# Patient Record
Sex: Male | Born: 1960 | Race: White | Hispanic: No | Marital: Married | Smoking: Current every day smoker
Health system: Southern US, Community
[De-identification: ages and names within clinical notes are randomized; demographics above are authoritative.]

## PROBLEM LIST (undated history)

## (undated) DIAGNOSIS — I359 Nonrheumatic aortic valve disorder, unspecified: Secondary | ICD-10-CM

## (undated) DIAGNOSIS — G8929 Other chronic pain: Secondary | ICD-10-CM

## (undated) DIAGNOSIS — R519 Headache, unspecified: Secondary | ICD-10-CM

## (undated) DIAGNOSIS — K029 Dental caries, unspecified: Secondary | ICD-10-CM

## (undated) DIAGNOSIS — G4700 Insomnia, unspecified: Secondary | ICD-10-CM

## (undated) DIAGNOSIS — D126 Benign neoplasm of colon, unspecified: Secondary | ICD-10-CM

## (undated) DIAGNOSIS — R51 Headache: Secondary | ICD-10-CM

## (undated) DIAGNOSIS — K561 Intussusception: Secondary | ICD-10-CM

## (undated) DIAGNOSIS — I10 Essential (primary) hypertension: Secondary | ICD-10-CM

## (undated) DIAGNOSIS — G2581 Restless legs syndrome: Secondary | ICD-10-CM

## (undated) DIAGNOSIS — F419 Anxiety disorder, unspecified: Secondary | ICD-10-CM

## (undated) DIAGNOSIS — E785 Hyperlipidemia, unspecified: Secondary | ICD-10-CM

## (undated) DIAGNOSIS — M549 Dorsalgia, unspecified: Secondary | ICD-10-CM

## (undated) DIAGNOSIS — J449 Chronic obstructive pulmonary disease, unspecified: Secondary | ICD-10-CM

## (undated) DIAGNOSIS — Z8673 Personal history of transient ischemic attack (TIA), and cerebral infarction without residual deficits: Secondary | ICD-10-CM

## (undated) DIAGNOSIS — M1990 Unspecified osteoarthritis, unspecified site: Secondary | ICD-10-CM

## (undated) DIAGNOSIS — R768 Other specified abnormal immunological findings in serum: Secondary | ICD-10-CM

## (undated) DIAGNOSIS — E119 Type 2 diabetes mellitus without complications: Secondary | ICD-10-CM

## (undated) HISTORY — PX: BACK SURGERY: SHX140

## (undated) HISTORY — DX: Intussusception: K56.1

## (undated) HISTORY — PX: BOWEL RESECTION: SHX1257

## (undated) HISTORY — PX: VASECTOMY: SHX75

## (undated) HISTORY — PX: CHOLECYSTECTOMY: SHX55

## (undated) HISTORY — DX: Other specified abnormal immunological findings in serum: R76.8

## (undated) HISTORY — DX: Type 2 diabetes mellitus without complications: E11.9

## (undated) HISTORY — PX: INTUSSUSCEPTION REPAIR: SHX1847

## (undated) HISTORY — DX: Benign neoplasm of colon, unspecified: D12.6

---

## 2000-04-27 ENCOUNTER — Encounter: Payer: Self-pay | Admitting: Neurosurgery

## 2000-04-27 ENCOUNTER — Ambulatory Visit (HOSPITAL_COMMUNITY): Admission: RE | Admit: 2000-04-27 | Discharge: 2000-04-27 | Payer: Self-pay | Admitting: Neurosurgery

## 2000-06-03 ENCOUNTER — Encounter: Payer: Self-pay | Admitting: Neurosurgery

## 2000-06-07 ENCOUNTER — Encounter: Payer: Self-pay | Admitting: Neurosurgery

## 2000-06-07 ENCOUNTER — Inpatient Hospital Stay (HOSPITAL_COMMUNITY): Admission: RE | Admit: 2000-06-07 | Discharge: 2000-06-10 | Payer: Self-pay | Admitting: Neurosurgery

## 2000-07-19 ENCOUNTER — Encounter: Payer: Self-pay | Admitting: Neurosurgery

## 2000-07-19 ENCOUNTER — Encounter: Admission: RE | Admit: 2000-07-19 | Discharge: 2000-07-19 | Payer: Self-pay | Admitting: Neurosurgery

## 2001-02-13 ENCOUNTER — Encounter: Payer: Self-pay | Admitting: Neurosurgery

## 2001-02-13 ENCOUNTER — Encounter: Admission: RE | Admit: 2001-02-13 | Discharge: 2001-02-13 | Payer: Self-pay | Admitting: Neurosurgery

## 2001-10-06 ENCOUNTER — Ambulatory Visit (HOSPITAL_COMMUNITY): Admission: RE | Admit: 2001-10-06 | Discharge: 2001-10-06 | Payer: Self-pay | Admitting: Neurosurgery

## 2001-10-06 ENCOUNTER — Encounter: Payer: Self-pay | Admitting: Neurosurgery

## 2003-12-23 ENCOUNTER — Emergency Department (HOSPITAL_COMMUNITY): Admission: EM | Admit: 2003-12-23 | Discharge: 2003-12-23 | Payer: Self-pay | Admitting: Emergency Medicine

## 2004-11-19 ENCOUNTER — Ambulatory Visit: Payer: Self-pay | Admitting: Cardiology

## 2006-03-04 ENCOUNTER — Encounter: Payer: Self-pay | Admitting: Emergency Medicine

## 2006-03-05 ENCOUNTER — Inpatient Hospital Stay (HOSPITAL_COMMUNITY): Admission: EM | Admit: 2006-03-05 | Discharge: 2006-03-07 | Payer: Self-pay | Admitting: Emergency Medicine

## 2006-03-05 ENCOUNTER — Ambulatory Visit: Payer: Self-pay | Admitting: Internal Medicine

## 2007-05-13 ENCOUNTER — Emergency Department (HOSPITAL_COMMUNITY): Admission: EM | Admit: 2007-05-13 | Discharge: 2007-05-13 | Payer: Self-pay | Admitting: Emergency Medicine

## 2008-12-22 ENCOUNTER — Emergency Department (HOSPITAL_COMMUNITY): Admission: EM | Admit: 2008-12-22 | Discharge: 2008-12-23 | Payer: Self-pay | Admitting: Emergency Medicine

## 2009-04-26 DIAGNOSIS — R768 Other specified abnormal immunological findings in serum: Secondary | ICD-10-CM

## 2009-04-26 HISTORY — DX: Other specified abnormal immunological findings in serum: R76.8

## 2010-01-29 ENCOUNTER — Ambulatory Visit: Payer: Self-pay | Admitting: Cardiology

## 2010-01-29 ENCOUNTER — Inpatient Hospital Stay (HOSPITAL_COMMUNITY): Admission: AD | Admit: 2010-01-29 | Discharge: 2010-01-31 | Payer: Self-pay

## 2010-01-30 ENCOUNTER — Encounter (INDEPENDENT_AMBULATORY_CARE_PROVIDER_SITE_OTHER): Payer: Self-pay | Admitting: Internal Medicine

## 2010-02-01 ENCOUNTER — Emergency Department (HOSPITAL_COMMUNITY): Admission: EM | Admit: 2010-02-01 | Discharge: 2010-02-02 | Payer: Self-pay | Admitting: Emergency Medicine

## 2010-03-11 ENCOUNTER — Emergency Department (HOSPITAL_COMMUNITY): Admission: EM | Admit: 2010-03-11 | Discharge: 2010-03-11 | Payer: Self-pay | Admitting: Emergency Medicine

## 2010-07-09 LAB — DIFFERENTIAL
Basophils Absolute: 0 10*3/uL (ref 0.0–0.1)
Basophils Relative: 1 % (ref 0–1)
Eosinophils Absolute: 0.3 10*3/uL (ref 0.0–0.7)
Eosinophils Relative: 1 % (ref 0–5)
Eosinophils Relative: 2 % (ref 0–5)
Eosinophils Relative: 3 % (ref 0–5)
Lymphocytes Relative: 29 % (ref 12–46)
Lymphs Abs: 2.7 10*3/uL (ref 0.7–4.0)
Monocytes Absolute: 0.9 10*3/uL (ref 0.1–1.0)
Monocytes Relative: 7 % (ref 3–12)
Monocytes Relative: 8 % (ref 3–12)
Neutro Abs: 7.9 10*3/uL — ABNORMAL HIGH (ref 1.7–7.7)
Neutrophils Relative %: 57 % (ref 43–77)
Neutrophils Relative %: 61 % (ref 43–77)

## 2010-07-09 LAB — GLUCOSE, CAPILLARY: Glucose-Capillary: 102 mg/dL — ABNORMAL HIGH (ref 70–99)

## 2010-07-09 LAB — CBC
HCT: 42 % (ref 39.0–52.0)
Hemoglobin: 13.8 g/dL (ref 13.0–17.0)
Hemoglobin: 14.3 g/dL (ref 13.0–17.0)
MCH: 33.7 pg (ref 26.0–34.0)
MCH: 34 pg (ref 26.0–34.0)
MCHC: 34.1 g/dL (ref 30.0–36.0)
MCV: 99.8 fL (ref 78.0–100.0)
MCV: 99.9 fL (ref 78.0–100.0)
Platelets: 241 10*3/uL (ref 150–400)
RBC: 4 MIL/uL — ABNORMAL LOW (ref 4.22–5.81)
RBC: 4.1 MIL/uL — ABNORMAL LOW (ref 4.22–5.81)
RBC: 4.21 MIL/uL — ABNORMAL LOW (ref 4.22–5.81)
RDW: 13.3 % (ref 11.5–15.5)
WBC: 12.3 10*3/uL — ABNORMAL HIGH (ref 4.0–10.5)

## 2010-07-09 LAB — HEPATIC FUNCTION PANEL
ALT: 104 U/L — ABNORMAL HIGH (ref 0–53)
AST: 51 U/L — ABNORMAL HIGH (ref 0–37)
Total Protein: 5.9 g/dL — ABNORMAL LOW (ref 6.0–8.3)

## 2010-07-09 LAB — LIPID PANEL
HDL: 37 mg/dL — ABNORMAL LOW (ref >39)
Triglycerides: 171 mg/dL — ABNORMAL HIGH (ref <150)
VLDL: 34 mg/dL (ref 0–40)

## 2010-07-09 LAB — URINALYSIS, ROUTINE W REFLEX MICROSCOPIC
Bilirubin Urine: NEGATIVE
Glucose, UA: NEGATIVE mg/dL
Hgb urine dipstick: NEGATIVE
Specific Gravity, Urine: 1.01 (ref 1.005–1.030)

## 2010-07-09 LAB — URINE CULTURE
Colony Count: 5000
Culture  Setup Time: 201110072117
Special Requests: NEGATIVE

## 2010-07-09 LAB — APTT: aPTT: 29 seconds (ref 24–37)

## 2010-07-09 LAB — COMPREHENSIVE METABOLIC PANEL
AST: 156 U/L — ABNORMAL HIGH (ref 0–37)
AST: 37 U/L (ref 0–37)
Albumin: 4.2 g/dL (ref 3.5–5.2)
Alkaline Phosphatase: 56 U/L (ref 39–117)
BUN: 10 mg/dL (ref 6–23)
BUN: 15 mg/dL (ref 6–23)
CO2: 28 mEq/L (ref 19–32)
Chloride: 103 mEq/L (ref 96–112)
Chloride: 105 mEq/L (ref 96–112)
Creatinine, Ser: 0.95 mg/dL (ref 0.4–1.5)
GFR calc Af Amer: >60 mL/min (ref >60)
GFR calc Af Amer: >60 mL/min (ref >60)
GFR calc non Af Amer: >60 mL/min (ref >60)
Glucose, Bld: 85 mg/dL (ref 70–99)
Potassium: 3 mEq/L — ABNORMAL LOW (ref 3.5–5.1)
Sodium: 140 mEq/L (ref 135–145)
Total Bilirubin: 0.8 mg/dL (ref 0.3–1.2)
Total Protein: 6.8 g/dL (ref 6.0–8.3)

## 2010-07-09 LAB — BASIC METABOLIC PANEL
CO2: 28 mEq/L (ref 19–32)
Chloride: 101 mEq/L (ref 96–112)
GFR calc Af Amer: >60 mL/min (ref >60)
GFR calc non Af Amer: >60 mL/min (ref >60)
Glucose, Bld: 98 mg/dL (ref 70–99)
Potassium: 3.3 mEq/L — ABNORMAL LOW (ref 3.5–5.1)
Potassium: 4 mEq/L (ref 3.5–5.1)
Sodium: 135 mEq/L (ref 135–145)
Sodium: 138 mEq/L (ref 135–145)

## 2010-07-09 LAB — HEMOGLOBIN A1C: Mean Plasma Glucose: 123 mg/dL — ABNORMAL HIGH (ref <117)

## 2010-07-09 LAB — CARDIAC PANEL(CRET KIN+CKTOT+MB+TROPI)
CK, MB: 2.3 ng/mL (ref 0.3–4.0)
Relative Index: INVALID (ref 0.0–2.5)
Total CK: 76 U/L (ref 7–232)
Troponin I: 0.02 ng/mL (ref 0.00–0.06)

## 2010-07-09 LAB — HEPATITIS PANEL, ACUTE
HCV Ab: REACTIVE — AB
Hep A IgM: NEGATIVE
Hep B C IgM: NEGATIVE

## 2010-07-09 LAB — RAPID URINE DRUG SCREEN, HOSP PERFORMED
Barbiturates: NOT DETECTED
Opiates: POSITIVE — AB

## 2010-07-09 LAB — PROTIME-INR
INR: 0.89 (ref 0.00–1.49)
Prothrombin Time: 12.2 seconds (ref 11.6–15.2)

## 2010-07-09 LAB — BRAIN NATRIURETIC PEPTIDE: Pro B Natriuretic peptide (BNP): 103 pg/mL — ABNORMAL HIGH (ref 0.0–100.0)

## 2010-07-09 LAB — MAGNESIUM: Magnesium: 1.9 mg/dL (ref 1.5–2.5)

## 2010-08-01 LAB — COMPREHENSIVE METABOLIC PANEL
AST: 63 U/L — ABNORMAL HIGH (ref 0–37)
BUN: 9 mg/dL (ref 6–23)
CO2: 27 mEq/L (ref 19–32)
Calcium: 10.6 mg/dL — ABNORMAL HIGH (ref 8.4–10.5)
Creatinine, Ser: 1.07 mg/dL (ref 0.4–1.5)
GFR calc Af Amer: >60 mL/min (ref >60)
GFR calc non Af Amer: >60 mL/min (ref >60)

## 2010-08-01 LAB — DIFFERENTIAL
Eosinophils Relative: 0 % (ref 0–5)
Lymphocytes Relative: 12 % (ref 12–46)
Lymphs Abs: 1.9 10*3/uL (ref 0.7–4.0)
Neutro Abs: 13.2 10*3/uL — ABNORMAL HIGH (ref 1.7–7.7)
Neutrophils Relative %: 85 % — ABNORMAL HIGH (ref 43–77)

## 2010-08-01 LAB — LIPASE, BLOOD: Lipase: 13 U/L (ref 11–59)

## 2010-08-01 LAB — CBC
MCHC: 34.4 g/dL (ref 30.0–36.0)
MCV: 93.7 fL (ref 78.0–100.0)
RBC: 5.65 MIL/uL (ref 4.22–5.81)

## 2010-08-01 LAB — POCT CARDIAC MARKERS

## 2010-09-11 NOTE — Discharge Summary (Signed)
Garrett Mitchell, Garrett Mitchell                ACCOUNT NO.:  [id number]   MEDICAL RECORD NO.:  [id number]          PATIENT TYPE:  INP   LOCATION:  3705                         FACILITY:  [facility]   PHYSICIAN:  Noralyn Pick. Eden Emms, MD, FACCDATE OF BIRTH:  10-29-1960   DATE OF ADMISSION:  03/05/2006  DATE OF DISCHARGE:  03/07/2006                                 DISCHARGE SUMMARY   PRIMARY CARDIOLOGIST:  Dr. Myrtis Ser.   PRIMARY CARE PHYSICIAN:  Dr. Clelia Croft in [place].   PRIMARY DIAGNOSIS:  Atypical chest pain.   SECONDARY DIAGNOSES:  1. Chronic low back pain, status post lumbar operation.  2. Bicuspid aortic valve, diagnosed in childhood.  3. Type 2 diabetes.  4. Hyperlipidemia.   PROCEDURES PERFORMED DURING THIS HOSPITALIZATION:  Stress Myoview study on  March 07, 2006.   ALLERGIES:  ULTRAM.   HISTORY OF PRESENT ILLNESS:  This is a 50 year old Caucasian male with a  past medical history significant for diabetes, tobacco abuse, and a family  history of premature coronary artery disease who presented to the emergency  room with a six-hour history of substernal chest pain, which he describes on  the left side of his chest, sharp, radiating around to his back.  He also  describes it as burning, worsening by inspiration.  He was seen at [facility] where his troponins were negative, but EKG 1- to 2-mm ST-  segment depression in leads V4 through V6.  The patient also has a history  of hypertension, with a blood pressure of 180/110 at [facility].  The  patient was treated in the emergency room with aspirin, heparin, Lopressor,  nitroglycerin, and morphine and transferred to [facility] Emergency Room for  further evaluation.   HOSPITAL COURSE:  On arrival to the emergency room, the patient's blood  pressure was 176/106, pulse of 60, and complaining of chest pain at 7/10.  The patient apparently had had a prescription for clonidine 0.2 mg four  times a day but stopped it two days ago because he ran  out of his  medication.  He usually wears a fentanyl patch for chronic back pain, which  he has not been using for the past several days also because he ran out of  his medication.  The patient was admitted for further evaluation and  treatment and was seen by Dr. Willa Rough over the weekend.  The patient's  EKG showed sinus rhythm with LVH noted with T-wave inversion in V3 and V4  with normalization V5 and V6.  Subsequent EKGs on day of discharge revealed  continued LVH.  The patient was treated with morphine q.4 h., which he  requested every 4 hours for continued low back pain.  He continued to have  some mild left-sided chest discomfort throughout hospitalization.  The  patient was started on heparin, and cardiac biomarkers were evaluated q.8 h.  Cardiac biomarkers were negative x3, with troponin at 0.01, CK 60, CK-MB  2.9.  The patient's hemoglobin Alc was found to be mildly elevated at 6.2.   Secondary to continued hypertension, the patient was started on  Lopressor 50  mg p.o. b.i.d.  His clonidine was not restarted during hospitalization.  Lipitor was also started during hospitalization secondary to elevated  cholesterol and LDL with a total cholesterol of 203, LDL of 110, HDL 82,  with triglycerides of 56.   The patient was seen by Dr. Willa Rough on March 05, 2006.  The patient  had negative cardiac enzymes but continued to have much back pain.  The  patient was also evaluated for pulmonary embolus, which was negative.  He  also had a CT scan to evaluate for aortic dissection, which was also  negative.  The patient continued stable, and a Myoview stress test was  ordered for March 06, 2006.  Myoview stress test was completed on  March 07, 2006, normal as read per Dr. Dietrich Pates.  This was an adenosine  stress test.  The patient did experience chronic low back pain throughout  the test but denied any chest discomfort throughout the test.  Followup  nuclear scintigraphy,  per Dr. Dietrich Pates, revealed negative for evidence of  ischemia.   LABS ON DISCHARGE:  Hemoglobin 12.9, hematocrit 37.7, white blood cells  11.2, platelets 259.  Sodium 136, potassium 3.2, chloride 100, CO2 of 26,  BUN 4, creatinine 0.09, glucose 126.  PT 12.2, INR 0.9, PTT 27.  Cardiac  enzymes were negative throughout hospitalization:  0.01, 0.02, and 0.01  respectively.  Cholesterol 203, LDL 110, HDL 82, triglycerides 56.  Calcium  was 10.  Hemoglobin Alc 6.2.   DISCHARGE MEDICATIONS:  1. Clonidine 0.2 mg four times a day (prescription given).  2. Fentanyl patches as directed by primary care physician.  3. Xanax 1 mg twice a day as directed by primary care physician.  4. Valium 10 mg at the time as directed by primary care physician.  5. Lipitor 40 mg at bedtime (new prescription).  6. Atenolol 100 mg at bedtime.   FOLLOWUP/APPOINTMENTS:  Patient should be scheduled for a follow-up  appointment with his primary care physician, Dr. Clelia Croft.  Echocardiogram  should be completed through [facility] in [place], as echocardiogram  was not completed during hospitalization.  The patient was given  instructions concerning his medications along with a new prescription for  Lipitor secondary to his elevated cholesterol.  The patient is to continue  with the clonidine and atenolol for blood pressure control.  He has also  been advised to be on a low-cholesterol diet.   Duration of discharge and encounter was 30 minutes.      [name], NP      Noralyn Pick. Eden Emms, MD, [facility]  Electronically Signed    [name]  D:  03/07/2006  T:  03/08/2006  Job:  [id number]   cc:   Dr. Clelia Croft in [place]

## 2010-09-11 NOTE — Discharge Summary (Signed)
[place]  Patient:    Garrett Mitchell, Garrett Mitchell                       MRN: [id number] Adm. Date:  [id number] Disc. Date: [id number] Attending:  Emeterio Reeve                           Discharge Summary  ADMITTING DIAGNOSIS:  L4/L5 and L5/S1 spondylosis.  DISCHARGE DIAGNOSIS:  L4/L5 and L5/S1 spondylosis.  PROCEDURE:  L4/L5 and L5/S1 laminectomy, diskectomy, posterior interbody fusion with Ray threaded fusion cage.  SURGEON:  Payton Doughty, M.D.  COMPLICATIONS:  None.  DISCHARGE:  Feeling well.  HISTORY OF PRESENT ILLNESS:  A 50 year old, right-handed, white gentleman whose history and physical is recounted in the chart.  January 2000 he fell 10 feet landing on his back, and he has had relentless increase in his back pain. MRI and plain films showed a chip fracture at the anterior L5 vertebral body and degenerative change at L5/S1.  Discography was positive at L4/L5 and him having failed conservative management, he is therefore admitted for fusion.  PAST MEDICAL HISTORY:  Anxiety.  MEDICATIONS:  He is on Xanax, trazodone, Percocet, and Vioxx.  ALLERGIES:  ULTRAM.  PAST SURGICAL HISTORY:  Bowel obstruction and cholecystectomy.  He has bicuspid aortic valve for which he receives antibiotics.  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  FAMILY HISTORY:  Mom is 51 and in good health, dad is deceased; he had hypertension and Cushings disease.  REVIEW OF SYSTEMS:  Remarkable for back pain.  PHYSICAL EXAMINATION:  GENERAL:  Unremarkable.  EXTREMITIES:  Somewhat limited range of motion in his back.  NEUROLOGIC:  He is awake, alert, and oriented.  Cranial nerves are intact. Motor exam shows intact strength.  Straight leg raise is negative.  Reflexes are 1 in the lower extremities, knees, and ankles.  He cannot range much more than about 15 degrees.   He cannot twist at all.  HOSPITAL COURSE:  He was admitted after ascertaining normal laboratory values. He underwent a L4/L5 and L5/S1 posterior lumbar interbody fusion. Postoperatively he did well.  Foley was removed the first day and physical therapy was started.  PCA was stopped the second day and by the third day he was awake, up and about, eating and voiding normally, and mobile and competent with his ADLs.  He was discharged on June 10, 2000, on OxyContin.  His followup will be in the [facility] office in a week for suture removal. DD:  08/24/00 TD:  08/25/00 Job: [id number]

## 2010-09-11 NOTE — Op Note (Signed)
[place]  Patient:    JOACHIM, CARTON                       MRN: [id number] Proc. Date: 06/07/00 Adm. Date:  [id number] Attending:  Emeterio Reeve                           Operative Report  PREOPERATIVE DIAGNOSIS:  Spondylosis L4-5, L5-S1.  POSTOPERATIVE DIAGNOSIS:  Spondylosis L4-5, L5-S1, with annular disruption at L4-5.  PROCEDURE:  L4-5, L5-S1 laminectomy and diskectomy, posterior lumbar interbody fusion with Ray Threaded Fusion Cage.  ANESTHESIA:  General endotracheal.  PREPARATION:  Sterile Betadine prep and scrub with alcohol wipe.  CLINICAL NOTE:  This is a 50 year old right-handed white gentleman who after a fall has had increasing back pain.  DESCRIPTION OF PROCEDURE:  He was taken to the operating room and and smoothly incised and intubated, placed prone on the operating table.  Following shave, prep, and drape in the usual sterile fashion, skin was infiltrated with 1% lidocaine and 1:400,000 epinephrine.  The skin was incised from the top of S1 to the bottom of L3, and the laminae of L4, L5, and S1 were exposed bilaterally in subperiosteal plane.  Intraoperative x-ray confirmed correctness of level.  The pars interarticularis, lamina, and inferior facet of L4 and L5 and the superior facet of L5 and S1 were removed bilaterally and the bone set aside for grafting.  Ligamentum flavum was removed, and the L4, L5, and S1 nerve roots were dissected free as they rounded their respective pedicles.  At L4-5, there was an annular disruption on the left side.  There was no disk material protruding through it.  At L5-S1, there was significant degenerative disease with facet arthropathy and synovium protruding from the facet joints.  Removal of the disk facilitated placement of Ray Threaded Fusion Cages, 16 x 21 mm.  Intraoperative x-ray was taken and confirmed good placement of the cages.  The nerve roots were carefully inspected and  found to be free.  The wound was irrigated and hemostasis assured.  The cages were packed with bone graft harvested from the facet joints and capped.  The fascia was reapproximated with 0 Vicryl in interrupted fashion.  Subcutaneous tissue was reapproximated with 0 Vicryl in interrupted fashion, subcuticular tissue was reapproximated with 3-0 Vicryl in interrupted fashion.  The skin was closed with 3-0 nylon in a running locked fashion.  Betadine and Telfa dressing was applied and made occlusive with OpSite.  The patient returned to the recovery room in good condition. DD:  06/07/00 TD:  06/08/00 Job: [id number]

## 2010-09-11 NOTE — H&P (Signed)
NAMECALIPH, BOROWIAK                ACCOUNT NO.:  [id number]   MEDICAL RECORD NO.:  [id number]          PATIENT TYPE:  INP   LOCATION:  3705                         FACILITY:  [facility]   PHYSICIAN:  Brayton El, MD    DATE OF BIRTH:  01-29-1961   DATE OF ADMISSION:  03/05/2006  DATE OF DISCHARGE:                                HISTORY & PHYSICAL   CHIEF COMPLAINT:  Chest pain radiating to the back.   HISTORY OF PRESENT ILLNESS:  The patient is a 50 year old white male, past  medical history significant for diabetes, tobacco use, and family history of  premature coronary artery disease presenting with a six-hour history of  substernal chest pain.  The patient states that around 6 p.m. this evening  he became very nauseous and vomited.  He shortly developed a burning  substernal chest pain that radiated directly through to his back that was  worsened by deep inspiration.  He reported to [facility] where  troponin was negative, but EKG showed 1 to 2 mm ST segment depression in  leads V4 through V6.  He was also very hypertensive at times, blood pressure  in excess of 180/110.  The patient was given aspirin, heparin, Lopressor,  nitroglycerin, and morphine, and transferred here to [facility] Emergency  Room.  Upon arrival here, the patient with a blood pressure of 176/106,  pulse of 60 complaining of 7/10 substernal chest pressure.  The patient  states that it continues to be associated with some nausea, but he denies  any shortness of breath or diaphoresis.  He also denies any previous episode  of such pain.  Of note, the patient states that he usually takes clonidine  0.2 mg p.o. q.i.d. but stopped  this two days ago because he ran out of the  medication.  The patient also usually takes a Fentanyl patch which he has  not been using for the past several days because he also ran out of this  medication.   ALLERGIES:  ULTRAM.   MEDICATIONS:  1. Clonidine 0.2 mg p.o. q.i.d.  2. Fentanyl patch.  3. Xanax 1 mg p.o. q.i.d.  4. Valium 10 mg p.o. q.h.s.   PAST MEDICAL HISTORY:  1. Type 2 diabetes.  2. Chronic low back pain status post some sort of lumbar operation.  3. Bicuspid aortic valve diagnosed in childhood.  4. Blocked bowel as a child.   SOCIAL HISTORY:  Positive for tobacco use.  The patient continues to smoke  one pack per day.  Negative for alcohol use.   FAMILY HISTORY:  The patient states his father died in his 58's from a  myocardial infarction.   REVIEW OF SYSTEMS:  Positive for some blurry vision times the past 48 hours  and some dizziness.  Chest pain as in HPI.  Also positive for a cough that  is mildly productive.  Positive for nausea, vomiting.  The patient denies  any hematochezia, melena, or hematemesis.  MUSCULOSKELETAL:  The patient  does complain of chronic lower back pain which is different than the  substernal chest  pain that is radiating through is upper back.  All other  systems are reviewed and are negative.   PHYSICAL EXAMINATION:  Temperature 98, pulse 69, blood pressure 181/101,  satting 99% on 2 liters.  GENERAL:  He is in moderate discomfort.  HEENT:  Normocephalic, atraumatic.  Extraocular movements are intact.  NECK:  Supple.  There are no JVD or bruits.  HEART:  Regular rate and rhythm with a 2/6 systolic murmur heard best at the  left lower sternal border.  LUNGS:  Clear to auscultation bilaterally.  ABDOMEN:  Soft, nontender, positive bowel sounds.  EXTREMITIES:  No cyanosis, clubbing, or edema.  SKIN:  No rashes or lesions.  NEUROLOGIC:  No focal deficits.  PSYCHIATRIC:  The patient is appropriate.   Chest x-ray was within normal limits.   EKG at the outside hospital showed normal sinus rhythm with T wave  inversions in V2 through V6 and 1 to 2 mm ST segment depression in leads V4  through V6 and biphasic T waves in leads 2 and aVF.  EKG upon arrival to  [facility] Emergency Room shows 1 to 2 mm ST segment  depression in leads V3,  V4, and biphasic T waves 2, 3, and aVF, normal sinus rhythm.   LABORATORY DATA:  Sodium 136, potassium 3.2, chloride 100, CO2 26, BUN 4,  creatinine 0.9, glucose 126, white count 14,000, hemoglobin 15.3, hematocrit  45.5, platelets 387.  CK-MB is 5.7.  Troponin is less than 0.05, myoglobin  is 58.6, INR is 0.9, BNP is 50.3, calcium is 10, magnesium is 1.8.   ASSESSMENT:  1. Patient with atypical chest pain for acute coronary syndrome but has      multiple risk factors and EKG changes consistent with anterior lateral      ischemia.  2. Hypertensive urgency.  3. Diabetes.  4. Tobacco use.   PLAN:  1. Will continue the patient on aspirin, heparin protocol, and      nitroglycerin.  2. Will add Lopressor 50 mg p.o. b.i.d. and Lipitor 40 mg p.o. q.h.s.  3. Will aggressively control blood pressure for this hypertensive urgency      which is most likely a result of rebound from stopping clonidine two      days ago.  Will titrate nitroglycerin up to desired effect and use      Lopressor IV p.r.n. to maintain a systolic blood pressure less than      150.  4. If cardiac enzymes were to increase, we will add a 2B3 inhibitor to his      medical regimen.  5. Because of the patient's very high blood pressures and sharp chest pain      radiating directly to his back, we will check a CT scan to the chest to      rule out a dissection as well as a pulmonary embolism.  6. Will check a fasting lipid panel in the morning.  7. Will replace electrolytes.  8. Would favor left heart catheterization in the a.m. considering risk      factors and ischemic EKG changes assuming that the patient rules out      for aortic dissection and pulmonary embolism.      Brayton El, MD  Electronically Signed     SGA/MEDQ  D:  03/05/2006  T:  03/05/2006  Job:  [id number]

## 2010-09-11 NOTE — H&P (Signed)
[place] [place] [facility]  Patient:    Garrett Mitchell, Garrett Mitchell                         MRN: [id number] Adm. Date:  06/07/00 Attending:  Payton Doughty, M.D.                         History and Physical  ADMISSION DIAGNOSIS:  L4-5 spondylosis, L5 anterior fracture, and L5-S1 spondylosis.  HISTORY OF PRESENT ILLNESS:  This is a 50 year old right-handed white gentleman in January of 2000 took a fall of 9 to 10 feet landing on his back on concrete.  He got up slowly and over the next few days had slow relentless increase in low back pain and episodic numbness in the anterior legs. Numbness in his legs has resolved with intermittent difficulty, but he has persistent low back pain with any activity.  If he lies down and puts a pillow behind his legs he can get somewhat comfortable.  Bladder has not been effected.  MRI and plane films demonstrated chip fracture of the anterior L5 vertebral body and degenerative change at L5-S1.  Diskography was positive at 4-5 and 5-1.  He is therefore admitted for a fusion at these levels having failed conservative management.  PAST MEDICAL HISTORY:  Remarkable for anxiety.  He is on Xanax 1 mg four times a day, Trasodone 150 mg once a day, Percocet on a p.r.n. basis, and Vioxx 25 mg a day.  ALLERGIES:  ULTRAM.  PAST SURGICAL HISTORY:  He had a bowel obstruction repaired at 52-1/50 years of age and a cholecystectomy done in 1998 at [place].  He has a bicuspid aortic valve for which he receives antibiotics for dental work.  SOCIAL HISTORY:  He smokes one pack of cigarettes a day.  He does not drink alcohol.  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  FAMILY HISTORY:  His mother is 44 and in good health.  Father is deceased. He had hypertension and Cushings disease.  REVIEW OF SYSTEMS:  Remarkable for back pain and depression.  PHYSICAL EXAMINATION:  HEENT:  Within normal limits.  He has reasonable range of motion in his  neck.  CHEST:  Clear.  HEART:  Regular rate and rhythm with a 2/6 holosystolic murmur.  ABDOMEN:  Nontender with no hepatosplenomegaly.  EXTREMITIES:  Without clubbing or cyanosis.  GENITOURINARY:  Deferred.  Peripheral pulses are good.  NEUROLOGICAL:  He is awake, alert, and oriented.  Cranial nerves II-XII intact. Motor examination shows 5/5 strength throughout the upper and lower extremities.  Straight leg raising is negative.  Reflexes are 1 in the lower extremities at the knees and ankles without sensory deficit.  Back has stiffness.  He cannot range much more than about 15 degrees without stopping. He cannot twist at all.  There is tenderness to palpation at the level of the posterior superior iliac spine in the midline.  Radiographic results have been reviewed above.  IMPRESSION: Lumbar spondylosis and lumbar strain following a fall.  I really do not think this is going to get better on its own.  He has waited 20 months, he has done physical therapy, and he has done steroids without improvement.  He has a concordant diskography and I think the most reasonable thing to do is fuse him at 4-5 and 5-1.  The risks and benefits of this approach have been discussed with him  and he wishes to proceed. DD:  06/07/00 TD:  06/07/00 Job: [id number]

## 2014-03-28 ENCOUNTER — Observation Stay (HOSPITAL_COMMUNITY)
Admission: EM | Admit: 2014-03-28 | Discharge: 2014-04-02 | Disposition: A | Discharge status: Home / Self Care | Payer: Medicare Other | Attending: Internal Medicine | Admitting: Internal Medicine

## 2014-03-28 ENCOUNTER — Emergency Department (HOSPITAL_COMMUNITY): Payer: Medicare Other

## 2014-03-28 ENCOUNTER — Encounter (HOSPITAL_COMMUNITY): Payer: Self-pay | Admitting: Emergency Medicine

## 2014-03-28 DIAGNOSIS — R011 Cardiac murmur, unspecified: Secondary | ICD-10-CM | POA: Diagnosis not present

## 2014-03-28 DIAGNOSIS — Z720 Tobacco use: Secondary | ICD-10-CM | POA: Diagnosis present

## 2014-03-28 DIAGNOSIS — M549 Dorsalgia, unspecified: Secondary | ICD-10-CM

## 2014-03-28 DIAGNOSIS — Z8673 Personal history of transient ischemic attack (TIA), and cerebral infarction without residual deficits: Secondary | ICD-10-CM | POA: Insufficient documentation

## 2014-03-28 DIAGNOSIS — M1990 Unspecified osteoarthritis, unspecified site: Secondary | ICD-10-CM | POA: Diagnosis present

## 2014-03-28 DIAGNOSIS — I10 Essential (primary) hypertension: Secondary | ICD-10-CM | POA: Diagnosis not present

## 2014-03-28 DIAGNOSIS — R000 Tachycardia, unspecified: Secondary | ICD-10-CM | POA: Insufficient documentation

## 2014-03-28 DIAGNOSIS — R079 Chest pain, unspecified: Secondary | ICD-10-CM | POA: Diagnosis not present

## 2014-03-28 DIAGNOSIS — G8929 Other chronic pain: Secondary | ICD-10-CM | POA: Diagnosis present

## 2014-03-28 DIAGNOSIS — L0291 Cutaneous abscess, unspecified: Secondary | ICD-10-CM | POA: Insufficient documentation

## 2014-03-28 DIAGNOSIS — K029 Dental caries, unspecified: Secondary | ICD-10-CM | POA: Diagnosis present

## 2014-03-28 DIAGNOSIS — K0889 Other specified disorders of teeth and supporting structures: Secondary | ICD-10-CM

## 2014-03-28 DIAGNOSIS — K088 Other specified disorders of teeth and supporting structures: Secondary | ICD-10-CM | POA: Diagnosis not present

## 2014-03-28 DIAGNOSIS — E876 Hypokalemia: Secondary | ICD-10-CM

## 2014-03-28 DIAGNOSIS — E785 Hyperlipidemia, unspecified: Secondary | ICD-10-CM | POA: Diagnosis present

## 2014-03-28 DIAGNOSIS — I359 Nonrheumatic aortic valve disorder, unspecified: Secondary | ICD-10-CM | POA: Diagnosis present

## 2014-03-28 HISTORY — DX: Nonrheumatic aortic valve disorder, unspecified: I35.9

## 2014-03-28 HISTORY — DX: Other chronic pain: G89.29

## 2014-03-28 HISTORY — DX: Dorsalgia, unspecified: M54.9

## 2014-03-28 HISTORY — DX: Essential (primary) hypertension: I10

## 2014-03-28 HISTORY — DX: Hyperlipidemia, unspecified: E78.5

## 2014-03-28 HISTORY — DX: Personal history of transient ischemic attack (TIA), and cerebral infarction without residual deficits: Z86.73

## 2014-03-28 HISTORY — DX: Unspecified osteoarthritis, unspecified site: M19.90

## 2014-03-28 HISTORY — DX: Dental caries, unspecified: K02.9

## 2014-03-28 LAB — COMPREHENSIVE METABOLIC PANEL
ALT: 57 U/L — ABNORMAL HIGH (ref 0–53)
AST: 46 U/L — ABNORMAL HIGH (ref 0–37)
Albumin: 4.5 g/dL (ref 3.5–5.2)
Alkaline Phosphatase: 102 U/L (ref 39–117)
Anion gap: 17 — ABNORMAL HIGH (ref 5–15)
BUN: 10 mg/dL (ref 6–23)
CO2: 18 mEq/L — ABNORMAL LOW (ref 19–32)
Calcium: 9.5 mg/dL (ref 8.4–10.5)
Chloride: 103 mEq/L (ref 96–112)
Creatinine, Ser: 1.02 mg/dL (ref 0.50–1.35)
GFR calc Af Amer: >90 mL/min (ref >90)
GFR calc non Af Amer: 82 mL/min — ABNORMAL LOW (ref >90)
Glucose, Bld: 138 mg/dL — ABNORMAL HIGH (ref 70–99)
Potassium: 3.3 mEq/L — ABNORMAL LOW (ref 3.7–5.3)
Sodium: 138 mEq/L (ref 137–147)
Total Bilirubin: 0.8 mg/dL (ref 0.3–1.2)
Total Protein: 7.8 g/dL (ref 6.0–8.3)

## 2014-03-28 LAB — CBC WITH DIFFERENTIAL/PLATELET
Basophils Absolute: 0 10*3/uL (ref 0.0–0.1)
Basophils Relative: 0 % (ref 0–1)
Eosinophils Absolute: 0 10*3/uL (ref 0.0–0.7)
Eosinophils Relative: 0 % (ref 0–5)
HCT: 45.5 % (ref 39.0–52.0)
Hemoglobin: 15.9 g/dL (ref 13.0–17.0)
Lymphocytes Relative: 24 % (ref 12–46)
Lymphs Abs: 3.6 10*3/uL (ref 0.7–4.0)
MCH: 32.1 pg (ref 26.0–34.0)
MCHC: 34.9 g/dL (ref 30.0–36.0)
MCV: 91.9 fL (ref 78.0–100.0)
Monocytes Absolute: 1.4 10*3/uL — ABNORMAL HIGH (ref 0.1–1.0)
Monocytes Relative: 9 % (ref 3–12)
Neutro Abs: 10.3 10*3/uL — ABNORMAL HIGH (ref 1.7–7.7)
Neutrophils Relative %: 67 % (ref 43–77)
Platelets: 362 10*3/uL (ref 150–400)
RBC: 4.95 MIL/uL (ref 4.22–5.81)
RDW: 13 % (ref 11.5–15.5)
WBC: 15.4 10*3/uL — ABNORMAL HIGH (ref 4.0–10.5)

## 2014-03-28 LAB — TROPONIN I
Troponin I: <0.3 ng/mL (ref <0.30)
Troponin I: <0.3 ng/mL (ref <0.30)
Troponin I: <0.3 ng/mL (ref <0.30)

## 2014-03-28 LAB — SEDIMENTATION RATE: Sed Rate: 11 mm/hr (ref 0–16)

## 2014-03-28 MED ORDER — MORPHINE SULFATE 4 MG/ML IJ SOLN
6.0000 mg | Freq: Once | INTRAMUSCULAR | Status: AC
  Administered 2014-03-28: 6 mg via INTRAVENOUS
  Filled 2014-03-28: qty 2

## 2014-03-28 MED ORDER — CLONIDINE HCL 0.2 MG PO TABS
0.2000 mg | ORAL_TABLET | Freq: Two times a day (BID) | ORAL | Status: DC
  Administered 2014-03-28 – 2014-04-02 (×10): 0.2 mg via ORAL
  Filled 2014-03-28 (×10): qty 1

## 2014-03-28 MED ORDER — CYCLOBENZAPRINE HCL 10 MG PO TABS
10.0000 mg | ORAL_TABLET | Freq: Three times a day (TID) | ORAL | Status: DC | PRN
  Administered 2014-04-01: 10 mg via ORAL
  Filled 2014-03-28: qty 1

## 2014-03-28 MED ORDER — HYDROMORPHONE HCL 1 MG/ML IJ SOLN
1.0000 mg | Freq: Once | INTRAMUSCULAR | Status: AC
  Administered 2014-03-28: 1 mg via INTRAVENOUS
  Filled 2014-03-28: qty 1

## 2014-03-28 MED ORDER — ONDANSETRON HCL 4 MG/2ML IJ SOLN: 4.0000 mg | Freq: Four times a day (QID) | INTRAMUSCULAR | Status: DC | PRN

## 2014-03-28 MED ORDER — AMLODIPINE BESYLATE 5 MG PO TABS
10.0000 mg | ORAL_TABLET | Freq: Every morning | ORAL | Status: DC
  Administered 2014-03-29 – 2014-04-02 (×5): 10 mg via ORAL
  Filled 2014-03-28 (×5): qty 2

## 2014-03-28 MED ORDER — ALPRAZOLAM 0.5 MG PO TABS
0.5000 mg | ORAL_TABLET | Freq: Three times a day (TID) | ORAL | Status: DC
  Administered 2014-03-28 – 2014-04-02 (×15): 0.5 mg via ORAL
  Filled 2014-03-28 (×16): qty 1

## 2014-03-28 MED ORDER — MORPHINE SULFATE ER 30 MG PO TBCR
60.0000 mg | EXTENDED_RELEASE_TABLET | Freq: Two times a day (BID) | ORAL | Status: DC
  Administered 2014-03-28 – 2014-04-02 (×10): 60 mg via ORAL
  Filled 2014-03-28 (×10): qty 2

## 2014-03-28 MED ORDER — SODIUM CHLORIDE 0.9 % IJ SOLN
3.0000 mL | Freq: Two times a day (BID) | INTRAMUSCULAR | Status: DC
  Administered 2014-03-28 – 2014-04-02 (×10): 3 mL via INTRAVENOUS

## 2014-03-28 MED ORDER — HEPARIN SODIUM (PORCINE) 5000 UNIT/ML IJ SOLN
5000.0000 [IU] | Freq: Three times a day (TID) | INTRAMUSCULAR | Status: DC
  Administered 2014-03-28 – 2014-04-02 (×16): 5000 [IU] via SUBCUTANEOUS
  Filled 2014-03-28 (×13): qty 1

## 2014-03-28 MED ORDER — MORPHINE SULFATE 4 MG/ML IJ SOLN
4.0000 mg | INTRAMUSCULAR | Status: DC | PRN
  Administered 2014-03-28 – 2014-03-31 (×15): 4 mg via INTRAVENOUS
  Filled 2014-03-28 (×16): qty 1

## 2014-03-28 MED ORDER — POTASSIUM CHLORIDE CRYS ER 10 MEQ PO TBCR
10.0000 meq | EXTENDED_RELEASE_TABLET | Freq: Every day | ORAL | Status: DC
  Administered 2014-03-28 – 2014-04-02 (×6): 10 meq via ORAL
  Filled 2014-03-28 (×6): qty 1

## 2014-03-28 MED ORDER — PENICILLIN V POTASSIUM 250 MG PO TABS
500.0000 mg | ORAL_TABLET | Freq: Once | ORAL | Status: AC
  Administered 2014-03-28: 500 mg via ORAL
  Filled 2014-03-28: qty 2

## 2014-03-28 MED ORDER — SIMVASTATIN 20 MG PO TABS
20.0000 mg | ORAL_TABLET | Freq: Every day | ORAL | Status: DC
  Administered 2014-03-28 – 2014-04-01 (×5): 20 mg via ORAL
  Filled 2014-03-28 (×5): qty 1

## 2014-03-28 MED ORDER — TOPIRAMATE 25 MG PO TABS
50.0000 mg | ORAL_TABLET | Freq: Two times a day (BID) | ORAL | Status: DC
  Administered 2014-03-28 – 2014-04-02 (×10): 50 mg via ORAL
  Filled 2014-03-28 (×12): qty 2

## 2014-03-28 MED ORDER — ONDANSETRON HCL 4 MG/2ML IJ SOLN
4.0000 mg | Freq: Once | INTRAMUSCULAR | Status: AC
  Administered 2014-03-28: 4 mg via INTRAMUSCULAR
  Filled 2014-03-28: qty 2

## 2014-03-28 MED ORDER — ONDANSETRON HCL 4 MG PO TABS: 4.0000 mg | ORAL_TABLET | Freq: Four times a day (QID) | ORAL | Status: DC | PRN

## 2014-03-28 MED ORDER — ASPIRIN 81 MG PO CHEW
324.0000 mg | CHEWABLE_TABLET | Freq: Once | ORAL | Status: AC
  Administered 2014-03-28: 324 mg via ORAL
  Filled 2014-03-28: qty 4

## 2014-03-28 MED ORDER — ROPINIROLE HCL 0.25 MG PO TABS
0.5000 mg | ORAL_TABLET | Freq: Every day | ORAL | Status: DC
  Administered 2014-03-28 – 2014-04-01 (×5): 0.5 mg via ORAL
  Filled 2014-03-28 (×6): qty 0.5

## 2014-03-28 MED ORDER — TRIAZOLAM 0.125 MG PO TABS
0.5000 mg | ORAL_TABLET | Freq: Every day | ORAL | Status: DC
  Administered 2014-03-28 – 2014-04-01 (×5): 0.5 mg via ORAL
  Filled 2014-03-28 (×5): qty 4

## 2014-03-28 NOTE — H&P (Signed)
[facility] History and Physical  Garrett Mitchell VOJ:[id number] DOB: [birth year] DOA: 03/28/2014  Referring physician: ER PCP: Monico Blitz, MD   Chief Complaint: Chest pain  HPI: Garrett Mitchell is a 53 y.o. male  This is a 53 year old man, hypertensive, these, strong family history of early coronary artery disease, who presents with chest pain which started yesterday. The pain is mid lower sternal area and does not radiate significantly. It is a combination of sharp pain with dull pain, he cannot specify. It is intermittent. It is not associated with sweating, nausea or dyspnea. He is a smoker of one pack of cigarettes per day. He is also concerned about right-sided jaw pain which he has had for the last 4-5 days from painful teeth. He is known to have aortic valve problem and has been told to take antibiotics if he has surgery on his teeth. Echocardiogram done on October 2011 does not show any major abnormalities of the aortic valve.   Review of Systems:  Constitutional:  No weight loss, night sweats, Fevers, chills, fatigue.  HEENT:  No headaches, Difficulty swallowing,Tooth/dental problems,Sore throat,  No sneezing, itching, ear ache, nasal congestion, post nasal drip,   GI:  No heartburn, indigestion, abdominal pain, nausea, vomiting, diarrhea, change in bowel habits, loss of appetite  Resp:  No shortness of breath with exertion or at rest. No excess mucus, no productive cough, No non-productive cough, No coughing up of blood.No change in color of mucus.No wheezing.No chest wall deformity  Skin:  no rash or lesions.  GU:  no dysuria, change in color of urine, no urgency or frequency. No flank pain.  Musculoskeletal:  No joint pain or swelling. No decreased range of motion. No back pain.  Psych:  No change in mood or affect. No depression or anxiety. No memory loss.   Past Medical History  Diagnosis Date  . Stroke   . Arthritis   . Hypertension   . Hyperlipemia     Past Surgical History  Procedure Laterality Date  . Back surgery    . Bowel resection    . Cholecystectomy    . Vasectomy     Social History:  reports that he has been smoking Cigarettes.  He has been smoking about 1.00 pack per day. He does not have any smokeless tobacco history on file. He reports that he does not drink alcohol or use illicit drugs.  No Known Allergies  No family history on file.   Prior to Admission medications   Medication Sig Start Date End Date Taking? Authorizing Provider  ALPRAZolam Duanne Moron) 0.5 MG tablet Take 0.5 mg by mouth 3 (three) times daily. For anxiety 02/27/14  Yes Historical Provider, MD  amLODipine (NORVASC) 10 MG tablet Take 10 mg by mouth every morning.  03/27/14  Yes Historical Provider, MD  butalbital-acetaminophen-caffeine (FIORICET, ESGIC) 50-325-40 MG per tablet Take 1 tablet by mouth 4 (four) times daily as needed for headache. For pain 03/27/14  Yes Historical Provider, MD  cyclobenzaprine (FLEXERIL) 10 MG tablet Take 10 mg by mouth 3 (three) times daily as needed for muscle spasms.  03/27/14  Yes Historical Provider, MD  HYDROcodone-acetaminophen (NORCO) 10-325 MG per tablet Take 1 tablet by mouth daily. For breakthrough pain 03/01/14  Yes Historical Provider, MD  Multiple Vitamin (MULTIVITAMIN WITH MINERALS) TABS tablet Take 1 tablet by mouth daily.   Yes Historical Provider, MD  OPANA ER, CRUSH RESISTANT, 20 MG T12A Take 1 tablet by mouth every 12 (twelve) hours as needed. For  pain 03/01/14  Yes Historical Provider, MD  potassium chloride (K-DUR) 10 MEQ tablet Take 10 mEq by mouth daily.   Yes Historical Provider, MD  rOPINIRole (REQUIP) 0.25 MG tablet Take 0.5 mg by mouth at bedtime.  03/27/14  Yes Historical Provider, MD  simvastatin (ZOCOR) 20 MG tablet Take 20 mg by mouth at bedtime. 03/27/14  Yes Historical Provider, MD  topiramate (TOPAMAX) 50 MG tablet Take 50 mg by mouth 2 (two) times daily. 03/27/14  Yes Historical Provider, MD  traMADol  (ULTRAM) 50 MG tablet Take 50-100 mg by mouth every 6 (six) hours as needed for moderate pain (for breakthrough pain).  02/27/14  Yes Historical Provider, MD  triazolam (HALCION) 0.25 MG tablet Take 0.5 mg by mouth at bedtime.  02/27/14  Yes Historical Provider, MD  Vitamin D, Ergocalciferol, (DRISDOL) 50000 UNITS CAPS capsule Take 50,000 Units by mouth every Monday.   Yes Historical Provider, MD  cloNIDine (CATAPRES) 0.2 MG tablet Take 0.2 mg by mouth 2 (two) times daily. 03/27/14   Historical Provider, MD   Physical Exam: Filed Vitals:   03/28/14 1508 03/28/14 1530 03/28/14 1600 03/28/14 1603  BP: 144/92 138/99 120/84 120/84  Pulse: 96 99 99 103  Temp:      TempSrc:      Resp: '18 18 18 17  ' Height:      Weight:      SpO2: 97% 92% 92% 94%    Wt Readings from Last 3 Encounters:  03/28/14 97.523 kg (215 lb)    General:  Appears calm and comfortable. Does not appear to be in pain. Eyes: PERRL, normal lids, irises & conjunctiva ENT: grossly normal hearing, lips & tongue Neck: no LAD, masses or thyromegaly Cardiovascular: Systolic murmur in the aortic area. Telemetry: SR, no arrhythmias  Respiratory: CTA bilaterally, no w/r/r. Normal respiratory effort. Abdomen: soft, ntnd Skin: no rash or induration seen on limited exam Musculoskeletal: grossly normal tone BUE/BLE Psychiatric: grossly normal mood and affect, speech fluent and appropriate Neurologic: grossly non-focal.          Labs on Admission:  Basic Metabolic Panel:  Recent Labs Lab 03/28/14 1234  NA 138  K 3.3*  CL 103  CO2 18*  GLUCOSE 138*  BUN 10  CREATININE 1.02  CALCIUM 9.5   Liver Function Tests:  Recent Labs Lab 03/28/14 1234  AST 46*  ALT 57*  ALKPHOS 102  BILITOT 0.8  PROT 7.8  ALBUMIN 4.5   No results for input(s): LIPASE, AMYLASE in the last 168 hours. No results for input(s): AMMONIA in the last 168 hours. CBC:  Recent Labs Lab 03/28/14 1234  WBC 15.4*  NEUTROABS 10.3*  HGB 15.9  HCT  45.5  MCV 91.9  PLT 362   Cardiac Enzymes:  Recent Labs Lab 03/28/14 1234  TROPONINI <0.30    BNP (last 3 results) No results for input(s): PROBNP in the last 8760 hours. CBG: No results for input(s): GLUCAP in the last 168 hours.  Radiological Exams on Admission: Dg Chest 2 View  03/28/2014   CLINICAL DATA:  Acute chest pain since yesterday, diabetes and hypertension, smoker  EXAM: CHEST - 2 VIEW  COMPARISON:  01/29/2010  FINDINGS: minor basilar atelectasis versus scarring. Normal heart size and vascularity. Mild hyperinflation, suspect degree of COPD/emphysema. No focal pneumonia, collapse or consolidation. Negative for edema, effusion or pneumothorax. Trachea midline.  IMPRESSION: Mild hyperinflation with basilar atelectasis versus scarring.   Electronically Signed   By: Daryll Brod M.D.   On:  03/28/2014 14:11    EKG: Independently reviewed. Normal sinus rhythm, left ventricular hypertrophy with strain pattern.  Assessment/Plan   1. Chest pain. The history is not vertically convincing for coronary artery disease but he does have multiple risk factors including tobacco abuse, family history, hypertension, obesity. 2. Hypertension. 3. Tobacco abuse. 4. Obesity. 5. Systolic murmur.  Plan: 1. Admit to telemetry. 2. Serial cardiac enzymes. 3. Echocardiogram. Check ESR. 4. Cardiology consultation.   Further recommendations will depend on patient's hospital progress.   Code Status: Full code.   DVT Prophylaxis: Heparin.  Family Communication: I discussed the plan with the patient at the bedside.   Disposition Plan: Home when medically stable.   Time spent: 60 minutes.  Doree Albee [facility] Pager [phone].

## 2014-03-28 NOTE — ED Notes (Signed)
PT c/o left sided chest pain x3 days with nausea and back pain. PT also c/o right lower dental pain x5 days.

## 2014-03-28 NOTE — Plan of Care (Signed)
Problem: Phase I Progression Outcomes Goal: Voiding-avoid urinary catheter unless indicated Outcome: Completed/Met Date Met:  03/28/14

## 2014-03-28 NOTE — ED Provider Notes (Signed)
CSN: [id number]     Arrival date & time 03/28/14  1214 History  This chart was scribed for Garrett Manifold, MD by Rayfield Citizen, ED Scribe. This patient was seen in room APA06/APA06 and the patient's care was started at 12:33 PM.    Chief Complaint  Patient presents with  . Chest Pain   The history is provided by the patient. No language interpreter was used.     HPI Comments: ELL TISO is a 53 y.o. male who presents to the Emergency Department complaining of 1 day of chest pain with nausea. He reports associated diaphoresis with initial episode but not since. Intermittent since then. No appreciable exacerbating or relieving factors. First time occurred while at rest. Also sensation of heart "having to beat really" hard with ambulation just down the hall of his home over the past week. He does not go as far as actually saying CP or SOB with this though. He denies unusual leg pain or swelling. He was told at one point at [facility] that he had an irregular heart valve; "if he ever had an infection in his teeth that spread to his heart, his heart would give out." He has also been told he has a heart murmur. He denies other heart issues. Per his report he has had a previous stress test with no worrisome results but this was years ago. Very strong family hx of CAD.  He also notes 3 days of constant pain in his right jaw pain. He reports poor dentition and dental pain. He notes headaches and subjective fever. He denies recent trauma or injury to the area.   Patient notes that he recently lost 10 pounds but has been trying to lose some weight.   Past Medical History  Diagnosis Date  . Stroke   . Arthritis   . Hypertension   . Hyperlipemia    Past Surgical History  Procedure Laterality Date  . Back surgery    . Bowel resection    . Cholecystectomy    . Vasectomy     No family history on file. History  Substance Use Topics  . Smoking status: Current Every Day Smoker -- 1.00 packs/day    Types:  Cigarettes  . Smokeless tobacco: Not on file  . Alcohol Use: No    Review of Systems  HENT: Positive for dental problem.   All other systems reviewed and are negative.     Allergies  Review of patient's allergies indicates no known allergies.  Home Medications   Prior to Admission medications   Not on File   BP 152/96 mmHg  Pulse 115  Temp(Src) 98.8 F (37.1 C) (Oral)  Resp 20  Ht 6' (1.829 m)  Wt 215 lb (97.523 kg)  BMI 29.15 kg/m2  SpO2 98% Physical Exam  Constitutional: He is oriented to person, place, and time. He appears well-developed and well-nourished. No distress.  HENT:  Head: Normocephalic.  Poor dentition with some mild facial swelling  Eyes: Conjunctivae are normal. Pupils are equal, round, and reactive to light. No scleral icterus.  Neck: Normal range of motion. Neck supple. No thyromegaly present.  Cardiovascular: Normal rate and regular rhythm.  Exam reveals no gallop and no friction rub.   Murmur heard. Tachycardic; systolic murmur  Pulmonary/Chest: Effort normal and breath sounds normal. No respiratory distress. He has no wheezes. He has no rales.  Abdominal: Soft. Bowel sounds are normal. He exhibits no distension. There is no tenderness. There is no rebound.  Musculoskeletal:  Normal range of motion.  Neurological: He is alert and oriented to person, place, and time.  Skin: Skin is warm and dry. No rash noted.  Psychiatric: He has a normal mood and affect. His behavior is normal.    ED Course  Procedures   DIAGNOSTIC STUDIES: Oxygen Saturation is 98% on RA, normal by my interpretation.    COORDINATION OF CARE: 12:40 PM Discussed treatment plan with pt at bedside and pt agreed to plan.  Labs Review Labs Reviewed  CBC WITH DIFFERENTIAL - Abnormal; Notable for the following:    WBC 15.4 (*)    Neutro Abs 10.3 (*)    Monocytes Absolute 1.4 (*)    All other components within normal limits  COMPREHENSIVE METABOLIC PANEL - Abnormal; Notable  for the following:    Potassium 3.3 (*)    CO2 18 (*)    Glucose, Bld 138 (*)    AST 46 (*)    ALT 57 (*)    GFR calc non Af Amer 82 (*)    Anion gap 17 (*)    All other components within normal limits  CULTURE, BLOOD (ROUTINE X 2)  CULTURE, BLOOD (ROUTINE X 2)  TROPONIN I    Imaging Review Dg Chest 2 View  03/28/2014   CLINICAL DATA:  Acute chest pain since yesterday, diabetes and hypertension, smoker  EXAM: CHEST - 2 VIEW  COMPARISON:  01/29/2010  FINDINGS: minor basilar atelectasis versus scarring. Normal heart size and vascularity. Mild hyperinflation, suspect degree of COPD/emphysema. No focal pneumonia, collapse or consolidation. Negative for edema, effusion or pneumothorax. Trachea midline.  IMPRESSION: Mild hyperinflation with basilar atelectasis versus scarring.   Electronically Signed   By: Daryll Brod M.D.   On: 03/28/2014 14:11     EKG Interpretation   Date/Time:  Thursday March 28 2014 12:28:02 EST Ventricular Rate:  117 PR Interval:  122 QRS Duration: 83 QT Interval:  471 QTC Calculation: 657 R Axis:   43 Text Interpretation:  Sinus tachycardia  left atrial enlargement Abnormal  R-wave progression, early transition lvh with strain since last tracing no  significant change Confirmed by Wilson Singer  MD, Jeremi Losito ([id number]) on 03/28/2014  1:12:29 PM      MDM   Final diagnoses:  Chest pain, unspecified chest pain type  Pain, dental   53yM with CP. Both typical and atypical symptoms. EKG showing LVH with repol abnormalities. Does not look acutely changed from priors. Several risk factors including what sounds like a pretty strong family hx. Concerning enough that I feel admission for r/o is prudent. Consider endocarditis as well. Clinically has dental infection. Murmur on exam although he says he has been told this previously. Doesn't sound regurgitant to me although my auscultation skills may be lacking from a better trained ear. Afebrile. Does have leukocytosis, but this  is nonspecific or could be from dental process. Reports 10 lbs weight loss although has been trying to lose weight. No obvious embolic phenomenon on my exam. Will send blood cultures. ECHO a consideration at discretion of admitting team. PCN for dental infection. Mild R facial swelling but no evidence of airway compromise or other significant deep space infection. Unless significantly worsens, should be appropriate for outpt dental FU.   I personally preformed the services scribed in my presence. The recorded information has been reviewed is accurate. Garrett Manifold, MD.      Garrett Manifold, MD 03/28/14 [phone]

## 2014-03-29 ENCOUNTER — Encounter (HOSPITAL_COMMUNITY): Payer: Self-pay | Admitting: Cardiology

## 2014-03-29 DIAGNOSIS — I10 Essential (primary) hypertension: Secondary | ICD-10-CM

## 2014-03-29 DIAGNOSIS — E876 Hypokalemia: Secondary | ICD-10-CM

## 2014-03-29 DIAGNOSIS — R079 Chest pain, unspecified: Secondary | ICD-10-CM | POA: Insufficient documentation

## 2014-03-29 DIAGNOSIS — R072 Precordial pain: Secondary | ICD-10-CM

## 2014-03-29 DIAGNOSIS — K029 Dental caries, unspecified: Secondary | ICD-10-CM | POA: Diagnosis present

## 2014-03-29 DIAGNOSIS — E785 Hyperlipidemia, unspecified: Secondary | ICD-10-CM | POA: Diagnosis present

## 2014-03-29 DIAGNOSIS — L0291 Cutaneous abscess, unspecified: Secondary | ICD-10-CM | POA: Insufficient documentation

## 2014-03-29 DIAGNOSIS — I359 Nonrheumatic aortic valve disorder, unspecified: Secondary | ICD-10-CM

## 2014-03-29 DIAGNOSIS — I059 Rheumatic mitral valve disease, unspecified: Secondary | ICD-10-CM

## 2014-03-29 DIAGNOSIS — K047 Periapical abscess without sinus: Secondary | ICD-10-CM

## 2014-03-29 DIAGNOSIS — M549 Dorsalgia, unspecified: Secondary | ICD-10-CM

## 2014-03-29 DIAGNOSIS — Z720 Tobacco use: Secondary | ICD-10-CM

## 2014-03-29 DIAGNOSIS — G8929 Other chronic pain: Secondary | ICD-10-CM | POA: Diagnosis present

## 2014-03-29 DIAGNOSIS — M1990 Unspecified osteoarthritis, unspecified site: Secondary | ICD-10-CM | POA: Diagnosis present

## 2014-03-29 LAB — COMPREHENSIVE METABOLIC PANEL
ALT: 181 U/L — AB (ref 0–53)
AST: 210 U/L — AB (ref 0–37)
Albumin: 3.6 g/dL (ref 3.5–5.2)
Alkaline Phosphatase: 114 U/L (ref 39–117)
Anion gap: 11 (ref 5–15)
BUN: 8 mg/dL (ref 6–23)
CALCIUM: 8.9 mg/dL (ref 8.4–10.5)
CO2: 25 meq/L (ref 19–32)
Chloride: 105 mEq/L (ref 96–112)
Creatinine, Ser: 1.12 mg/dL (ref 0.50–1.35)
GFR, EST AFRICAN AMERICAN: 85 mL/min — AB (ref >90)
GFR, EST NON AFRICAN AMERICAN: 73 mL/min — AB (ref >90)
Glucose, Bld: 124 mg/dL — ABNORMAL HIGH (ref 70–99)
POTASSIUM: 3.5 meq/L — AB (ref 3.7–5.3)
SODIUM: 141 meq/L (ref 137–147)
TOTAL PROTEIN: 6.8 g/dL (ref 6.0–8.3)
Total Bilirubin: 0.6 mg/dL (ref 0.3–1.2)

## 2014-03-29 LAB — TSH: TSH: 0.844 u[IU]/mL (ref 0.350–4.500)

## 2014-03-29 LAB — CBC
HEMATOCRIT: 41.5 % (ref 39.0–52.0)
Hemoglobin: 14 g/dL (ref 13.0–17.0)
MCH: 32.2 pg (ref 26.0–34.0)
MCHC: 33.7 g/dL (ref 30.0–36.0)
MCV: 95.4 fL (ref 78.0–100.0)
PLATELETS: 299 10*3/uL (ref 150–400)
RBC: 4.35 MIL/uL (ref 4.22–5.81)
RDW: 13.2 % (ref 11.5–15.5)
WBC: 9.2 10*3/uL (ref 4.0–10.5)

## 2014-03-29 LAB — LIPID PANEL
CHOLESTEROL: 103 mg/dL (ref 0–200)
HDL: 37 mg/dL — AB (ref >39)
LDL Cholesterol: 29 mg/dL (ref 0–99)
TRIGLYCERIDES: 183 mg/dL — AB (ref <150)
Total CHOL/HDL Ratio: 2.8 RATIO
VLDL: 37 mg/dL (ref 0–40)

## 2014-03-29 LAB — TROPONIN I: Troponin I: <0.3 ng/mL (ref <0.30)

## 2014-03-29 LAB — HEMOGLOBIN A1C
HEMOGLOBIN A1C: 6 % — AB (ref <5.7)
Mean Plasma Glucose: 126 mg/dL — ABNORMAL HIGH (ref <117)

## 2014-03-29 MED ORDER — AMOXICILLIN-POT CLAVULANATE 875-125 MG PO TABS
ORAL_TABLET | ORAL | Status: AC
  Filled 2014-03-29: qty 1

## 2014-03-29 MED ORDER — NICOTINE 21 MG/24HR TD PT24
21.0000 mg | MEDICATED_PATCH | Freq: Every day | TRANSDERMAL | Status: DC
  Administered 2014-03-30 – 2014-04-02 (×5): 21 mg via TRANSDERMAL
  Filled 2014-03-29 (×3): qty 1

## 2014-03-29 MED ORDER — POTASSIUM CHLORIDE CRYS ER 20 MEQ PO TBCR
40.0000 meq | EXTENDED_RELEASE_TABLET | Freq: Once | ORAL | Status: AC
  Administered 2014-03-29: 40 meq via ORAL

## 2014-03-29 MED ORDER — AMOXICILLIN-POT CLAVULANATE 875-125 MG PO TABS
1.0000 | ORAL_TABLET | Freq: Two times a day (BID) | ORAL | Status: DC
  Administered 2014-03-29 – 2014-04-02 (×8): 1 via ORAL
  Filled 2014-03-29 (×6): qty 1

## 2014-03-29 MED ORDER — POTASSIUM CHLORIDE CRYS ER 20 MEQ PO TBCR
EXTENDED_RELEASE_TABLET | ORAL | Status: AC
  Filled 2014-03-29: qty 2

## 2014-03-29 NOTE — Progress Notes (Signed)
[facility] PROGRESS NOTE  Garrett Mitchell HFW:[id number] DOB: Nov 28, 1960 DOA: 03/28/2014 PCP: Monico Blitz, MD  Assessment/Plan:  Chest pain: typical and atypical features. He is smoker, family hx CAD with severe dental caries and possible abscess. Much improved this am. Troponin negative to date, EKG abnormal. TSH within limits of normal.  Reports hx of aortic valve as child. Await echo results for further data on valves. Evaluated by cardiology who recommend if blood cultures positive will likely need TEE and transfer to [facility] for dental evaluation. In addition cards note indicates invasive testing would not be pursued until bacteremia clarified.  Aortic valve disease: per patient congenital. Cardiology opines likely bicuspid. Await echo for further evaluation    Dental caries with concern for abscess. Subjective fever at home. Given 500mg  penicillin V potassium in ED.  Leukocytosis resolved, afebrile, non-toxic appearing. Will obtain orthopantogram. Blood cultures pending. Consider antibiotics once results imaging obtained.     Hypokalemia: mild. Will replete and recheck    HTN (hypertension): controlled. Home meds include norvasc, catapres. Will continue and monitor  Chronic back pain: appears stable at baseline. Home meds include Opana, norco, ultram. Continue opana, using morphine for breakthrough given above and hold ultram.     Tobacco abuse: cessation counseling    Arthritis: stable    Hyperlipemia: await lipid panel       Code Status: full Family Communication: none present Disposition Plan: home when ready   Consultants:  cardiology  Procedures:  echo  Antibiotics:  none  HPI/Subjective: Complains continued right jaw pain and intermittent chest pain. No nausea or sob  Objective: Filed Vitals:   03/29/14 0549  BP: 120/75  Pulse: 87  Temp: 98.4 F (36.9 C)  Resp: 20    Intake/Output Summary (Last 24 hours) at 03/29/14 1210 Last data filed at  03/29/14 0800  Gross per 24 hour  Intake    480 ml  Output   1800 ml  Net  -1320 ml   Filed Weights   03/28/14 1228 03/28/14 1659  Weight: 97.523 kg (215 lb) 94.348 kg (208 lb)    Exam:   General:  Well nourished appears comfortable  Cardiovascular: RRR +murmur no gallup no rub. No LE edema PPP  Respiratory: normal effort BS clear bilaterally to auscultation. No wheeze  Abdomen: obese soft non-distended +BS non-tender  Musculoskeletal: no clubbing or cyanosis   Data Reviewed: Basic Metabolic Panel:  Recent Labs Lab 03/28/14 1234 03/29/14 0433  NA 138 141  K 3.3* 3.5*  CL 103 105  CO2 18* 25  GLUCOSE 138* 124*  BUN 10 8  CREATININE 1.02 1.12  CALCIUM 9.5 8.9   Liver Function Tests:  Recent Labs Lab 03/28/14 1234 03/29/14 0433  AST 46* 210*  ALT 57* 181*  ALKPHOS 102 114  BILITOT 0.8 0.6  PROT 7.8 6.8  ALBUMIN 4.5 3.6   No results for input(s): LIPASE, AMYLASE in the last 168 hours. No results for input(s): AMMONIA in the last 168 hours. CBC:  Recent Labs Lab 03/28/14 1234 03/29/14 0433  WBC 15.4* 9.2  NEUTROABS 10.3*  --   HGB 15.9 14.0  HCT 45.5 41.5  MCV 91.9 95.4  PLT 362 299   Cardiac Enzymes:  Recent Labs Lab 03/28/14 1234 03/28/14 1632 03/28/14 2157 03/29/14 0433  TROPONINI <0.30 <0.30 <0.30 <0.30   BNP (last 3 results) No results for input(s): PROBNP in the last 8760 hours. CBG: No results for input(s): GLUCAP in the last 168 hours.  No results found  for this or any previous visit (from the past 240 hour(s)).   Studies: Dg Chest 2 View  03/28/2014   CLINICAL DATA:  Acute chest pain since yesterday, diabetes and hypertension, smoker  EXAM: CHEST - 2 VIEW  COMPARISON:  01/29/2010  FINDINGS: minor basilar atelectasis versus scarring. Normal heart size and vascularity. Mild hyperinflation, suspect degree of COPD/emphysema. No focal pneumonia, collapse or consolidation. Negative for edema, effusion or pneumothorax. Trachea  midline.  IMPRESSION: Mild hyperinflation with basilar atelectasis versus scarring.   Electronically Signed   By: Daryll Brod M.D.   On: 03/28/2014 14:11    Scheduled Meds: . ALPRAZolam  0.5 mg Oral TID  . amLODipine  10 mg Oral q morning - 10a  . cloNIDine  0.2 mg Oral BID  . heparin  5,000 Units Subcutaneous 3 times per day  . morphine  60 mg Oral Q12H  . potassium chloride  10 mEq Oral Daily  . rOPINIRole  0.5 mg Oral QHS  . simvastatin  20 mg Oral QHS  . sodium chloride  3 mL Intravenous Q12H  . topiramate  50 mg Oral BID  . triazolam  0.5 mg Oral QHS   Continuous Infusions:   Active Problems:     Time spent: [facility] Pager [phone]. If 7PM-7AM, please contact night-coverage at [email or web address], password [facility] 03/29/2014, 12:10 PM  LOS: 1 day

## 2014-03-29 NOTE — Progress Notes (Signed)
Pt admitted from home with CP. Pt lives with his wife and will return home at discharge. Pt has hx of CVA in the past and does require some assistance with ADl's. Pt may require transfer to [facility] for furthur cardiology workup. Will continue to follow for discharge planning needs.

## 2014-03-29 NOTE — Progress Notes (Signed)
Nutrition Brief Note  Patient identified on the Malnutrition Screening Tool (MST) Report  Wt Readings from Last 15 Encounters:  03/28/14 208 lb (94.348 kg)   Chart reviewed. Weight loss has been intentional. Appetite is good.   Body mass index is 28.2 kg/(m^2). Patient meets criteria for overweight based on current BMI.   Current diet order is Heart Healthy, patient is consuming approximately 100% of meals at this time. Labs and medications reviewed.   No nutrition interventions warranted at this time. If nutrition issues arise, please consult RD.   Garrett Mitchell A. Jimmye Norman, RD, LDN Pager: [phone]

## 2014-03-29 NOTE — Progress Notes (Signed)
  Echocardiogram 2D Echocardiogram has been performed.  [place], [place] 03/29/2014, 5:23 PM

## 2014-03-29 NOTE — Consult Note (Signed)
Consulting cardiologist: Dr. Satira Sark  Reason for consultation: Chest pain  Clinical Summary Garrett Mitchell is a 53 y.o.male followed by Dr. Manuella Mitchell with [facility], now admitted to the hospital complaining of progressive right jaw pain related to a reported dental abscess with incomplete access to dental care. He reports malaise and also subjective fevers recently, onset of chest tightness yesterday as well. He reports having a congenitally abnormal aortic valve, details are not clear but I would suspect a bicuspid aortic valve. I do not have access to prior echocardiogram information at this time. He states that he became most concerned that he had an "infection" and wanted to get checked out.  He has been afebrile here under observation, WBC was initially 15.4 however. Blood cultures have been sent and are pending. Troponin I levels are also negative arguing against ACS, but his ECG is significantly abnormal showing LVH and probable repolarization abnormalities, although ischemia not excluded. I do not have an old tracing for comparison. Chest x-ray does not demonstrate any pulmonary edema.  He has no documented history of obstructive CAD or myocardial infarction, however prior history of stroke, active tobacco abuse, hypertension, and a family history of premature cardiovascular disease in paternal relatives.   No Known Allergies  Medications Scheduled Medications: . ALPRAZolam  0.5 mg Oral TID  . amLODipine  10 mg Oral q morning - 10a  . cloNIDine  0.2 mg Oral BID  . heparin  5,000 Units Subcutaneous 3 times per day  . morphine  60 mg Oral Q12H  . potassium chloride  10 mEq Oral Daily  . rOPINIRole  0.5 mg Oral QHS  . simvastatin  20 mg Oral QHS  . sodium chloride  3 mL Intravenous Q12H  . topiramate  50 mg Oral BID  . triazolam  0.5 mg Oral QHS     PRN Medications: cyclobenzaprine, morphine injection, ondansetron **OR** ondansetron (ZOFRAN) IV   Past  Medical History  Diagnosis Date  . History of stroke   . Arthritis   . Essential hypertension   . Hyperlipemia   . Aortic valve disease     Presumably bicuspid - limited information   . Dental caries   . Chronic back pain     Past Surgical History  Procedure Laterality Date  . Back surgery    . Bowel resection    . Cholecystectomy    . Vasectomy      Family History  Problem Relation Age of Onset  . CAD Father     Premature disease    Social History Garrett Mitchell reports that he has been smoking Cigarettes.  He has been smoking about 1.00 pack per day. He does not have any smokeless tobacco history on file. Garrett Mitchell reports that he does not drink alcohol.  Review of Systems Complete review of systems negative except as otherwise outlined in the clinical summary and also the following. No palpitations or syncope. Reports chronic back pain for which she is disabled. No claudication.  Physical Examination Blood pressure 120/75, pulse 87, temperature 98.4 F (36.9 C), temperature source Oral, resp. rate 20, height 6' (1.829 m), weight 208 lb (94.348 kg), SpO2 100 %.  Intake/Output Summary (Last 24 hours) at 03/29/14 1012 Last data filed at 03/29/14 0659  Gross per 24 hour  Intake    240 ml  Output   1800 ml  Net  -1560 ml   Telemetry: Sinus rhythm, burst of NSVT noted that was asymptomatic.  Chronically ill-appearing overweight male in no distress. HEENT: Conjunctiva and lids normal, oropharynx clear with poor dentition. Neck: Supple, no elevated JVP, probable radiation of cardiac murmur although cannot exclude carotid bruits, no thyromegaly. Lungs: Clear to auscultation, nonlabored breathing at rest. Cardiac: Regular rate and rhythm, no S3, 3/6 systolic murmur, no pericardial rub. Abdomen: Soft, nontender, bowel sounds present, no guarding or rebound. Extremities: No pitting edema, distal pulses 2+. Skin: Warm and dry. No rashes. Musculoskeletal: No  kyphosis. Neuropsychiatric: Alert and oriented x3, affect grossly appropriate.   Lab Results  Basic Metabolic Panel:  Recent Labs Lab 03/28/14 1234 03/29/14 0433  NA 138 141  K 3.3* 3.5*  CL 103 105  CO2 18* 25  GLUCOSE 138* 124*  BUN 10 8  CREATININE 1.02 1.12  CALCIUM 9.5 8.9    Liver Function Tests:  Recent Labs Lab 03/28/14 1234 03/29/14 0433  AST 46* 210*  ALT 57* 181*  ALKPHOS 102 114  BILITOT 0.8 0.6  PROT 7.8 6.8  ALBUMIN 4.5 3.6    CBC:  Recent Labs Lab 03/28/14 1234 03/29/14 0433  WBC 15.4* 9.2  NEUTROABS 10.3*  --   HGB 15.9 14.0  HCT 45.5 41.5  MCV 91.9 95.4  PLT 362 299    Cardiac Enzymes:  Recent Labs Lab 03/28/14 1234 03/28/14 1632 03/28/14 2157 03/29/14 0433  TROPONINI <0.30 <0.30 <0.30 <0.30    ESR: 11   Imaging EXAM: CHEST - 2 VIEW  COMPARISON: 01/29/2010  FINDINGS: minor basilar atelectasis versus scarring. Normal heart size and vascularity. Mild hyperinflation, suspect degree of COPD/emphysema. No focal pneumonia, collapse or consolidation. Negative for edema, effusion or pneumothorax. Trachea midline.  IMPRESSION: Mild hyperinflation with basilar atelectasis versus scarring.   Impression  1. Chest pain. Patient states that he feels better today. Etiology is not clear. ECG abnormal as noted above, although could be secondary to LVH with repolarization abnormalities, no old tracing available. He has a history of presumed bicuspid aortic valve, could potentially have aortic valve stenosis with LV strain. Also personal risk factors for CAD as well as family history of premature CAD certainly raises the possibility of coronary atherosclerosis. Cardiac markers are however normal and argue against ACS.  2. Reported congenitally abnormal aortic valve, presumed we bicuspid but no other information is available. Cardiac murmur suggest some component of aortic valve stenosis.  3. Reported dental abscess with increasing  pain, subjective fevers at home, and malaise. He has had poor access to dental care, reports problems with insurance. Initial WBC abnormal, however he has been afebrile here, blood cultures pending, ESR normal.  4. Essential hypertension.  5. Reported history of hyperlipidemia. On statin therapy as outpatient.  6. History of stroke. Details not clear.   Recommendations  Complex patient, etiology of presentation is not entirely clear as yet. Agree with concerns about dental abscess and potential for bacteremia, particularly with reportedly congenitally abnormal aortic valve. We will arrange an echocardiogram to further evaluate. If he does have positive blood cultures, may even require TEE. If this is the case, I would recommend moving him to [facility] on the hospitalist team so that he can actually have a dental evaluation, particularly since he has not been able to access this as an outpatient. Further workup as of his chest pain remains another issue to consider, and we may need to pursue ischemic evaluation as well depending on the above workup. Invasive cardiac testing would not be pursued until presence or absence of bacteremia has been clarified.  Satira Sark, M.D., F.A.C.C.

## 2014-03-30 DIAGNOSIS — K029 Dental caries, unspecified: Secondary | ICD-10-CM

## 2014-03-30 LAB — CBC
HEMATOCRIT: 39.5 % (ref 39.0–52.0)
HEMOGLOBIN: 13.1 g/dL (ref 13.0–17.0)
MCH: 32 pg (ref 26.0–34.0)
MCHC: 33.2 g/dL (ref 30.0–36.0)
MCV: 96.6 fL (ref 78.0–100.0)
Platelets: 271 10*3/uL (ref 150–400)
RBC: 4.09 MIL/uL — ABNORMAL LOW (ref 4.22–5.81)
RDW: 13 % (ref 11.5–15.5)
WBC: 9 10*3/uL (ref 4.0–10.5)

## 2014-03-30 LAB — BASIC METABOLIC PANEL
Anion gap: 10 (ref 5–15)
BUN: 9 mg/dL (ref 6–23)
CO2: 26 mEq/L (ref 19–32)
CREATININE: 1.01 mg/dL (ref 0.50–1.35)
Calcium: 8.9 mg/dL (ref 8.4–10.5)
Chloride: 106 mEq/L (ref 96–112)
GFR calc non Af Amer: 83 mL/min — ABNORMAL LOW (ref >90)
GLUCOSE: 109 mg/dL — AB (ref 70–99)
Potassium: 3.8 mEq/L (ref 3.7–5.3)
Sodium: 142 mEq/L (ref 137–147)

## 2014-03-30 MED ORDER — AMOXICILLIN-POT CLAVULANATE 875-125 MG PO TABS
ORAL_TABLET | ORAL | Status: AC
Start: 2014-03-30 — End: 2014-03-30
  Administered 2014-03-30: 09:00:00
  Filled 2014-03-30: qty 1

## 2014-03-30 MED ORDER — NICOTINE 21 MG/24HR TD PT24
MEDICATED_PATCH | TRANSDERMAL | Status: AC
  Filled 2014-03-30: qty 1

## 2014-03-30 MED ORDER — NICOTINE 21 MG/24HR TD PT24
MEDICATED_PATCH | TRANSDERMAL | Status: AC
  Administered 2014-03-30: 09:00:00
  Filled 2014-03-30: qty 1

## 2014-03-30 NOTE — Progress Notes (Signed)
Utilization Review completed.  

## 2014-03-30 NOTE — Plan of Care (Signed)
Problem: Consults Goal: Chest Pain Patient Education (See Patient Education module for education specifics.)  Outcome: Progressing Goal: Skin Care Protocol Initiated - if Braden Score 18 or less If consults are not indicated, leave blank or document N/A  Outcome: Not Applicable Date Met:  01/72/41  Problem: Phase I Progression Outcomes Goal: Hemodynamically stable Outcome: Completed/Met Date Met:  03/30/14

## 2014-03-30 NOTE — Progress Notes (Signed)
[facility] PROGRESS NOTE  Garrett Mitchell [name]:[id number] DOB: 11/11/1960 DOA: 03/28/2014 PCP: Monico Blitz, MD  Assessment/Plan: Chest Pain -No further episodes. -ECHO pending. -Troponin negative. -EKG is abnormal: LVH and ?repolarization abnormalities, ischemia not excluded. -Await ECHO results and further cardiology determination as to whether ischemic workup to be done. -He states he has some abnormality of the aortic valve... ECHO in 2011 was normal  Poor dentition/?dental abscess -No access to OP dental care. -Unable to obtain orthopantogram. -Has been started on Augmentin for potential abscess. -BC negative to date.  HTN -Controlled.  Tobacco Abuse -Counseled on cessation.  Code Status: Full Code Family Communication: Patient only  Disposition Plan: to be determined   Consultants:  Cardiology, Dr. Domenic Polite   Antibiotics:  Augmentin   Subjective: Wants to see a dentist. No CP.  Objective: Filed Vitals:   03/29/14 0549 03/29/14 1402 03/29/14 2211 03/30/14 0619  BP: 120/75 114/70 108/61 128/81  Pulse: 87 81 75 87  Temp: 98.4 F (36.9 C) 98.6 F (37 C) 99.2 F (37.3 C) 99.4 F (37.4 C)  TempSrc: Oral Oral Oral Oral  Resp: 20 20 20 20   Height:      Weight:      SpO2: 100% 99% 100% 100%    Intake/Output Summary (Last 24 hours) at 03/30/14 1621 Last data filed at 03/30/14 0500  Gross per 24 hour  Intake    480 ml  Output   2000 ml  Net  -1520 ml   Filed Weights   03/28/14 1228 03/28/14 1659  Weight: 97.523 kg (215 lb) 94.348 kg (208 lb)    Exam:   General:  AA Ox3  Cardiovascular: RRR, systolic murmur  Respiratory: CTA B  Abdomen: S/NT/ND/+BS  Extremities: trace bilateral edema   Neurologic:  Intact, non-focal  Data Reviewed: Basic Metabolic Panel:  Recent Labs Lab 03/28/14 1234 03/29/14 0433 03/30/14 0638  NA 138 141 142  K 3.3* 3.5* 3.8  CL 103 105 106  CO2 18* 25 26  GLUCOSE 138* 124* 109*  BUN 10 8 9     CREATININE 1.02 1.12 1.01  CALCIUM 9.5 8.9 8.9   Liver Function Tests:  Recent Labs Lab 03/28/14 1234 03/29/14 0433  AST 46* 210*  ALT 57* 181*  ALKPHOS 102 114  BILITOT 0.8 0.6  PROT 7.8 6.8  ALBUMIN 4.5 3.6   No results for input(s): LIPASE, AMYLASE in the last 168 hours. No results for input(s): AMMONIA in the last 168 hours. CBC:  Recent Labs Lab 03/28/14 1234 03/29/14 0433 03/30/14 0638  WBC 15.4* 9.2 9.0  NEUTROABS 10.3*  --   --   HGB 15.9 14.0 13.1  HCT 45.5 41.5 39.5  MCV 91.9 95.4 96.6  PLT 362 299 271   Cardiac Enzymes:  Recent Labs Lab 03/28/14 1234 03/28/14 1632 03/28/14 2157 03/29/14 0433  TROPONINI <0.30 <0.30 <0.30 <0.30   BNP (last 3 results) No results for input(s): PROBNP in the last 8760 hours. CBG: No results for input(s): GLUCAP in the last 168 hours.  Recent Results (from the past 240 hour(s))  Blood culture (routine x 2)     Status: None (Preliminary result)   Collection Time: 03/28/14  2:38 PM  Result Value Ref Range Status   Specimen Description BLOOD RIGHT ANTECUBITAL  Final   Special Requests   Final    BOTTLES DRAWN AEROBIC AND ANAEROBIC AEB=8CC ANA=7CC   Culture NO GROWTH 2 DAYS  Final   Report Status PENDING  Incomplete  Blood  culture (routine x 2)     Status: None (Preliminary result)   Collection Time: 03/28/14  2:59 PM  Result Value Ref Range Status   Specimen Description BLOOD RIGHT HAND  Final   Special Requests BOTTLES DRAWN AEROBIC ONLY 4CC  Final   Culture NO GROWTH 2 DAYS  Final   Report Status PENDING  Incomplete     Studies: No results found.  Scheduled Meds: . ALPRAZolam  0.5 mg Oral TID  . amLODipine  10 mg Oral q morning - 10a  . amoxicillin-clavulanate  1 tablet Oral Q12H  . cloNIDine  0.2 mg Oral BID  . heparin  5,000 Units Subcutaneous 3 times per day  . morphine  60 mg Oral Q12H  . nicotine  21 mg Transdermal Daily  . potassium chloride  10 mEq Oral Daily  . rOPINIRole  0.5 mg Oral QHS  .  simvastatin  20 mg Oral QHS  . sodium chloride  3 mL Intravenous Q12H  . topiramate  50 mg Oral BID  . triazolam  0.5 mg Oral QHS   Continuous Infusions:   Active Problems:   Chest pain   HTN (hypertension)   Tobacco abuse   Arthritis   Hyperlipemia   Aortic valve disease   Dental caries   Chronic back pain   Hypokalemia   Abscess   Pain in the chest    Time spent: 25 minutes. Greater than 50% of this time was spent in direct contact with the patient coordinating care.    Lelon Frohlich  [facility] Pager [phone]  If 7PM-7AM, please contact night-coverage at [email or web address], password [email or web address] 03/30/2014, 4:21 PM  LOS: 2 days

## 2014-03-31 DIAGNOSIS — K088 Other specified disorders of teeth and supporting structures: Secondary | ICD-10-CM | POA: Diagnosis not present

## 2014-03-31 DIAGNOSIS — R079 Chest pain, unspecified: Secondary | ICD-10-CM | POA: Diagnosis not present

## 2014-03-31 DIAGNOSIS — M1990 Unspecified osteoarthritis, unspecified site: Secondary | ICD-10-CM | POA: Diagnosis not present

## 2014-03-31 DIAGNOSIS — I10 Essential (primary) hypertension: Secondary | ICD-10-CM | POA: Diagnosis not present

## 2014-03-31 LAB — TROPONIN I
Troponin I: <0.3 ng/mL (ref <0.30)
Troponin I: <0.3 ng/mL (ref <0.30)

## 2014-03-31 MED ORDER — OXYCODONE-ACETAMINOPHEN 5-325 MG PO TABS
1.0000 | ORAL_TABLET | Freq: Once | ORAL | Status: AC
  Administered 2014-04-02: 1 via ORAL
  Filled 2014-03-31: qty 1

## 2014-03-31 MED ORDER — MORPHINE SULFATE 4 MG/ML IJ SOLN
4.0000 mg | INTRAMUSCULAR | Status: DC | PRN
  Administered 2014-03-31 – 2014-04-02 (×11): 4 mg via INTRAVENOUS
  Filled 2014-03-31 (×13): qty 1

## 2014-03-31 NOTE — Progress Notes (Signed)
UR completed 

## 2014-03-31 NOTE — Progress Notes (Signed)
[facility] PROGRESS NOTE  Garrett Mitchell FIE:[id number] DOB: [birth year] DOA: 03/28/2014 PCP: Monico Blitz, MD  Assessment/Plan: Chest Pain -Mild CP thruout the night, precordial without radiation. Will recheck enzymes and EKG. -ECHO pending. -Troponins negative. -EKG is abnormal: LVH and ?repolarization abnormalities, ischemia not excluded. -Await ECHO results and further cardiology determination as to whether ischemic workup to be done. -He states he has some abnormality of the aortic valve... ECHO in 2011 was normal  Poor dentition/?dental abscess -No access to OP dental care. -Unable to obtain orthopantogram. -Has been started on Augmentin for potential abscess. -BC negative to date.  HTN -Controlled.  Tobacco Abuse -Counseled on cessation.  Code Status: Full Code Family Communication: Patient only  Disposition Plan: to be determined   Consultants:  Cardiology, Dr. Domenic Polite   Antibiotics:  Augmentin   Subjective: Wants to see a dentist. Some CP overnight.  Objective: Filed Vitals:   03/30/14 0619 03/30/14 1702 03/30/14 2114 03/31/14 0910  BP: 128/81 107/67 112/76 147/74  Pulse: 87 78 76 81  Temp: 99.4 F (37.4 C) 98.1 F (36.7 C) 98.4 F (36.9 C) 98.6 F (37 C)  TempSrc: Oral Oral Oral Oral  Resp: 20 20 20 20   Height:      Weight:      SpO2: 100% 98% 95% 98%    Intake/Output Summary (Last 24 hours) at 03/31/14 1408 Last data filed at 03/31/14 0800  Gross per 24 hour  Intake    960 ml  Output   5000 ml  Net  -4040 ml   Filed Weights   03/28/14 1228 03/28/14 1659  Weight: 97.523 kg (215 lb) 94.348 kg (208 lb)    Exam:   General:  AA Ox3  Cardiovascular: RRR, systolic murmur  Respiratory: CTA B  Abdomen: S/NT/ND/+BS  Extremities: trace bilateral edema   Neurologic:  Intact, non-focal  Data Reviewed: Basic Metabolic Panel:  Recent Labs Lab 03/28/14 1234 03/29/14 0433 03/30/14 0638  NA 138 141 142  K 3.3* 3.5*  3.8  CL 103 105 106  CO2 18* 25 26  GLUCOSE 138* 124* 109*  BUN 10 8 9   CREATININE 1.02 1.12 1.01  CALCIUM 9.5 8.9 8.9   Liver Function Tests:  Recent Labs Lab 03/28/14 1234 03/29/14 0433  AST 46* 210*  ALT 57* 181*  ALKPHOS 102 114  BILITOT 0.8 0.6  PROT 7.8 6.8  ALBUMIN 4.5 3.6   No results for input(s): LIPASE, AMYLASE in the last 168 hours. No results for input(s): AMMONIA in the last 168 hours. CBC:  Recent Labs Lab 03/28/14 1234 03/29/14 0433 03/30/14 0638  WBC 15.4* 9.2 9.0  NEUTROABS 10.3*  --   --   HGB 15.9 14.0 13.1  HCT 45.5 41.5 39.5  MCV 91.9 95.4 96.6  PLT 362 299 271   Cardiac Enzymes:  Recent Labs Lab 03/28/14 1234 03/28/14 1632 03/28/14 2157 03/29/14 0433  TROPONINI <0.30 <0.30 <0.30 <0.30   BNP (last 3 results) No results for input(s): PROBNP in the last 8760 hours. CBG: No results for input(s): GLUCAP in the last 168 hours.  Recent Results (from the past 240 hour(s))  Blood culture (routine x 2)     Status: None (Preliminary result)   Collection Time: 03/28/14  2:38 PM  Result Value Ref Range Status   Specimen Description BLOOD RIGHT ANTECUBITAL  Final   Special Requests   Final    BOTTLES DRAWN AEROBIC AND ANAEROBIC AEB=8CC ANA=7CC   Culture NO GROWTH 3 DAYS  Final   Report Status PENDING  Incomplete  Blood culture (routine x 2)     Status: None (Preliminary result)   Collection Time: 03/28/14  2:59 PM  Result Value Ref Range Status   Specimen Description BLOOD RIGHT HAND  Final   Special Requests BOTTLES DRAWN AEROBIC ONLY 4CC  Final   Culture NO GROWTH 3 DAYS  Final   Report Status PENDING  Incomplete     Studies: No results found.  Scheduled Meds: . ALPRAZolam  0.5 mg Oral TID  . amLODipine  10 mg Oral q morning - 10a  . amoxicillin-clavulanate  1 tablet Oral Q12H  . cloNIDine  0.2 mg Oral BID  . heparin  5,000 Units Subcutaneous 3 times per day  . morphine  60 mg Oral Q12H  . nicotine  21 mg Transdermal Daily  .  oxyCODONE-acetaminophen  1 tablet Oral Once  . potassium chloride  10 mEq Oral Daily  . rOPINIRole  0.5 mg Oral QHS  . simvastatin  20 mg Oral QHS  . sodium chloride  3 mL Intravenous Q12H  . topiramate  50 mg Oral BID  . triazolam  0.5 mg Oral QHS   Continuous Infusions:   Active Problems:   Chest pain   HTN (hypertension)   Tobacco abuse   Arthritis   Hyperlipemia   Aortic valve disease   Dental caries   Chronic back pain   Hypokalemia   Abscess   Pain in the chest    Time spent: 25 minutes. Greater than 50% of this time was spent in direct contact with the patient coordinating care.    Lelon Frohlich  [facility] Pager [phone]  If 7PM-7AM, please contact night-coverage at [email or web address], password [email or web address] 03/31/2014, 2:08 PM  LOS: 3 days

## 2014-03-31 NOTE — Plan of Care (Signed)
Problem: Consults Goal: Chest Pain Patient Education (See Patient Education module for education specifics.)  Outcome: Completed/Met Date Met:  03/31/14 Goal: Nutrition Consult-if indicated Outcome: Not Applicable Date Met:  09/47/09  Problem: Phase II Progression Outcomes Goal: Hemodynamically stable Outcome: Completed/Met Date Met:  03/31/14

## 2014-04-01 DIAGNOSIS — R0789 Other chest pain: Secondary | ICD-10-CM

## 2014-04-01 LAB — TROPONIN I: Troponin I: <0.3 ng/mL (ref <0.30)

## 2014-04-01 MED ORDER — ASPIRIN EC 81 MG PO TBEC
81.0000 mg | DELAYED_RELEASE_TABLET | Freq: Every day | ORAL | Status: DC
  Administered 2014-04-01 – 2014-04-02 (×2): 81 mg via ORAL
  Filled 2014-04-01 (×2): qty 1

## 2014-04-01 NOTE — Progress Notes (Signed)
Subjective:   Intermittent chest pain overnight   Objective:   Temp:  [97.5 F (36.4 C)-98.6 F (37 C)] 97.5 F (36.4 C) (12/07 0639) Pulse Rate:  [66-81] 66 (12/07 0639) Resp:  [20] 20 (12/07 0639) BP: (118-147)/(67-80) 130/80 mmHg (12/07 0639) SpO2:  [97 %-100 %] 100 % (12/07 0639) Last BM Date: 03/29/14  Filed Weights   03/28/14 1228 03/28/14 1659  Weight: 215 lb (97.523 kg) 208 lb (94.348 kg)    Intake/Output Summary (Last 24 hours) at 04/01/14 0840 Last data filed at 04/01/14 1540  Gross per 24 hour  Intake   1680 ml  Output   2500 ml  Net   -820 ml    Telemetry: NSR Exam:  General: NAD  Resp: CTAB  Cardiac: RRR, 2/6 systolic murmur RUSB, no JVD  GI: abdomen soft, NT, ND  MSK: no LE edema  Neuro: no focal deficits   Lab Results:  Basic Metabolic Panel:  Recent Labs Lab 03/28/14 1234 03/29/14 0433 03/30/14 0638  NA 138 141 142  K 3.3* 3.5* 3.8  CL 103 105 106  CO2 18* 25 26  GLUCOSE 138* 124* 109*  BUN 10 8 9   CREATININE 1.02 1.12 1.01  CALCIUM 9.5 8.9 8.9    Liver Function Tests:  Recent Labs Lab 03/28/14 1234 03/29/14 0433  AST 46* 210*  ALT 57* 181*  ALKPHOS 102 114  BILITOT 0.8 0.6  PROT 7.8 6.8  ALBUMIN 4.5 3.6    CBC:  Recent Labs Lab 03/28/14 1234 03/29/14 0433 03/30/14 0638  WBC 15.4* 9.2 9.0  HGB 15.9 14.0 13.1  HCT 45.5 41.5 39.5  MCV 91.9 95.4 96.6  PLT 362 299 271    Cardiac Enzymes:  Recent Labs Lab 03/31/14 1442 03/31/14 2053 04/01/14 0224  TROPONINI <0.30 <0.30 <0.30    BNP: No results for input(s): PROBNP in the last 8760 hours.  Coagulation: No results for input(s): INR in the last 168 hours.  ECG:   Medications:   Scheduled Medications: . ALPRAZolam  0.5 mg Oral TID  . amLODipine  10 mg Oral q morning - 10a  . amoxicillin-clavulanate  1 tablet Oral Q12H  . cloNIDine  0.2 mg Oral BID  . heparin  5,000 Units Subcutaneous 3 times per day  . morphine  60 mg Oral Q12H  .  nicotine  21 mg Transdermal Daily  . oxyCODONE-acetaminophen  1 tablet Oral Once  . potassium chloride  10 mEq Oral Daily  . rOPINIRole  0.5 mg Oral QHS  . simvastatin  20 mg Oral QHS  . sodium chloride  3 mL Intravenous Q12H  . topiramate  50 mg Oral BID  . triazolam  0.5 mg Oral QHS     Infusions:     PRN Medications:  cyclobenzaprine, morphine injection, ondansetron **OR** ondansetron (ZOFRAN) IV     Assessment/Plan    1. 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 States he was followed at [facility] until the age of 21 and then released from care, has not had to see a cardiologist since. - he has had repeat echo this admit however due to computer issues over the weekend have not been able to review. From review on echo machine moderate to severe concentric LVH, LVEF hyperdynamic 70-75%, no WMAs, grade II diastolic dysfunction. There is a mild subvavlular gradient due to [facility] in the setting of concentric LVH and hyperdynamic LV  function. Cannot distinguish number of AV cusps, however there is no stenosis and no regurgitation. No evidence of valvular vegetation.  - echo 2011 moderate LVH, LVEF 70-75%, no WMAs. AV reported as trileaflet with no significant stenosis or regurgitation. Unable to pull up old images due to ongoing compute issues  - no further valve testing indicated at this point. Given dynamic gradient will start beta blocker tomorrow after stress test tomorrow, emphasized importance of hydration.   2. Chest pain - enzymes negative, EKG with LVH and probable strain pattern however cannot rule out ischemia. He does have CAD risk factors - plan for exercise cardiolite, he ate breakfast so plan for tomorrow. - start ASA 81mg  daily  3. Tooth abscess - abx per primary team - blood cultures negative     Carlyle Dolly, M.D.

## 2014-04-01 NOTE — Progress Notes (Signed)
UR completed 

## 2014-04-01 NOTE — Progress Notes (Signed)
[facility] PROGRESS NOTE  Garrett Mitchell MHD:[id number] DOB: April 23, 1961 DOA: 03/28/2014 PCP: Monico Blitz, MD  Assessment/Plan: Chest Pain -Continues to have intermittent chest pain. -Repeat troponins all negative. -ECHO pending (official reading) -EKG is abnormal: LVH and ?repolarization abnormalities, ischemia not excluded. -For stress tests in the morning.  Poor dentition/?dental abscess -No access to OP dental care. -Unable to obtain orthopantogram. -Has been started on Augmentin for potential abscess. -BC negative to date.  HTN -Controlled.  Tobacco Abuse -Counseled on cessation.  Code Status: Full Code Family Communication: Patient only  Disposition Plan: to be determined   Consultants:  Cardiology, Dr. Domenic Polite   Antibiotics:  Augmentin   Subjective: Wants to see a dentist. Some CP overnight.  Objective: Filed Vitals:   03/31/14 0910 03/31/14 1447 03/31/14 2240 04/01/14 0639  BP: 147/74 118/71 118/67 130/80  Pulse: 81 70 77 66  Temp: 98.6 F (37 C) 97.9 F (36.6 C) 98.3 F (36.8 C) 97.5 F (36.4 C)  TempSrc: Oral Oral Oral Oral  Resp: 20 20 20 20   Height:      Weight:      SpO2: 98% 97% 100% 100%    Intake/Output Summary (Last 24 hours) at 04/01/14 1551 Last data filed at 04/01/14 1435  Gross per 24 hour  Intake   1920 ml  Output   2500 ml  Net   -580 ml   Filed Weights   03/28/14 1228 03/28/14 1659  Weight: 97.523 kg (215 lb) 94.348 kg (208 lb)    Exam:   General:  AA Ox3  Cardiovascular: RRR, systolic murmur  Respiratory: CTA B  Abdomen: S/NT/ND/+BS  Extremities: trace bilateral edema   Neurologic:  Intact, non-focal  Data Reviewed: Basic Metabolic Panel:  Recent Labs Lab 03/28/14 1234 03/29/14 0433 03/30/14 0638  NA 138 141 142  K 3.3* 3.5* 3.8  CL 103 105 106  CO2 18* 25 26  GLUCOSE 138* 124* 109*  BUN 10 8 9   CREATININE 1.02 1.12 1.01  CALCIUM 9.5 8.9 8.9   Liver Function Tests:  Recent  Labs Lab 03/28/14 1234 03/29/14 0433  AST 46* 210*  ALT 57* 181*  ALKPHOS 102 114  BILITOT 0.8 0.6  PROT 7.8 6.8  ALBUMIN 4.5 3.6   No results for input(s): LIPASE, AMYLASE in the last 168 hours. No results for input(s): AMMONIA in the last 168 hours. CBC:  Recent Labs Lab 03/28/14 1234 03/29/14 0433 03/30/14 0638  WBC 15.4* 9.2 9.0  NEUTROABS 10.3*  --   --   HGB 15.9 14.0 13.1  HCT 45.5 41.5 39.5  MCV 91.9 95.4 96.6  PLT 362 299 271   Cardiac Enzymes:  Recent Labs Lab 03/28/14 2157 03/29/14 0433 03/31/14 1442 03/31/14 2053 04/01/14 0224  TROPONINI <0.30 <0.30 <0.30 <0.30 <0.30   BNP (last 3 results) No results for input(s): PROBNP in the last 8760 hours. CBG: No results for input(s): GLUCAP in the last 168 hours.  Recent Results (from the past 240 hour(s))  Blood culture (routine x 2)     Status: None (Preliminary result)   Collection Time: 03/28/14  2:38 PM  Result Value Ref Range Status   Specimen Description BLOOD RIGHT ANTECUBITAL  Final   Special Requests   Final    BOTTLES DRAWN AEROBIC AND ANAEROBIC AEB=8CC ANA=7CC   Culture NO GROWTH 4 DAYS  Final   Report Status PENDING  Incomplete  Blood culture (routine x 2)     Status: None (Preliminary result)  Collection Time: 03/28/14  2:59 PM  Result Value Ref Range Status   Specimen Description BLOOD RIGHT HAND  Final   Special Requests BOTTLES DRAWN AEROBIC ONLY 4CC  Final   Culture NO GROWTH 4 DAYS  Final   Report Status PENDING  Incomplete     Studies: No results found.  Scheduled Meds: . ALPRAZolam  0.5 mg Oral TID  . amLODipine  10 mg Oral q morning - 10a  . amoxicillin-clavulanate  1 tablet Oral Q12H  . aspirin EC  81 mg Oral Daily  . cloNIDine  0.2 mg Oral BID  . heparin  5,000 Units Subcutaneous 3 times per day  . morphine  60 mg Oral Q12H  . nicotine  21 mg Transdermal Daily  . oxyCODONE-acetaminophen  1 tablet Oral Once  . potassium chloride  10 mEq Oral Daily  . rOPINIRole  0.5  mg Oral QHS  . simvastatin  20 mg Oral QHS  . sodium chloride  3 mL Intravenous Q12H  . topiramate  50 mg Oral BID  . triazolam  0.5 mg Oral QHS   Continuous Infusions:   Active Problems:   Chest pain   HTN (hypertension)   Tobacco abuse   Arthritis   Hyperlipemia   Aortic valve disease   Dental caries   Chronic back pain   Hypokalemia   Abscess   Pain in the chest    Time spent: 25 minutes. Greater than 50% of this time was spent in direct contact with the patient coordinating care.    Lelon Frohlich  [facility] Pager [phone]  If 7PM-7AM, please contact night-coverage at [email or web address], password [facility] 04/01/2014, 3:51 PM  LOS: 4 days

## 2014-04-02 ENCOUNTER — Observation Stay (HOSPITAL_COMMUNITY): Payer: Medicare Other

## 2014-04-02 ENCOUNTER — Encounter (HOSPITAL_COMMUNITY): Payer: Self-pay

## 2014-04-02 LAB — CULTURE, BLOOD (ROUTINE X 2)
Culture: NO GROWTH
Culture: NO GROWTH

## 2014-04-02 MED ORDER — REGADENOSON 0.4 MG/5ML IV SOLN
0.4000 mg | Freq: Once | INTRAVENOUS | Status: AC | PRN
  Administered 2014-04-02: 0.4 mg via INTRAVENOUS
  Filled 2014-04-02: qty 5

## 2014-04-02 MED ORDER — TECHNETIUM TC 99M SESTAMIBI GENERIC - CARDIOLITE
30.0000 | Freq: Once | INTRAVENOUS | Status: AC | PRN
  Administered 2014-04-02: 30 via INTRAVENOUS

## 2014-04-02 MED ORDER — ASPIRIN 81 MG PO TBEC: 81.0000 mg | DELAYED_RELEASE_TABLET | Freq: Every day | ORAL | Status: AC

## 2014-04-02 MED ORDER — SODIUM CHLORIDE 0.9 % IJ SOLN
10.0000 mL | INTRAMUSCULAR | Status: DC | PRN
  Administered 2014-04-02: 10 mL via INTRAVENOUS
  Filled 2014-04-02: qty 10

## 2014-04-02 MED ORDER — METOPROLOL TARTRATE 12.5 MG HALF TABLET: 12.5000 mg | ORAL_TABLET | Freq: Two times a day (BID) | ORAL | Status: DC

## 2014-04-02 MED ORDER — AMOXICILLIN-POT CLAVULANATE 875-125 MG PO TABS: 1.0000 | ORAL_TABLET | Freq: Two times a day (BID) | ORAL | Status: AC

## 2014-04-02 MED ORDER — REGADENOSON 0.4 MG/5ML IV SOLN
INTRAVENOUS | Status: AC
  Administered 2014-04-02: 0.4 mg via INTRAVENOUS
  Filled 2014-04-02: qty 5

## 2014-04-02 MED ORDER — METOPROLOL TARTRATE 25 MG PO TABS
12.5000 mg | ORAL_TABLET | Freq: Two times a day (BID) | ORAL | Status: DC
  Administered 2014-04-02: 12.5 mg via ORAL
  Filled 2014-04-02: qty 1

## 2014-04-02 MED ORDER — TECHNETIUM TC 99M SESTAMIBI - CARDIOLITE
10.0000 | Freq: Once | INTRAVENOUS | Status: AC | PRN
  Administered 2014-04-02: 07:00:00 9.5 via INTRAVENOUS

## 2014-04-02 MED ORDER — SODIUM CHLORIDE 0.9 % IJ SOLN
INTRAMUSCULAR | Status: AC
  Administered 2014-04-02: 10 mL via INTRAVENOUS
  Filled 2014-04-02: qty 10

## 2014-04-02 MED ORDER — NICOTINE 21 MG/24HR TD PT24: 21.0000 mg | MEDICATED_PATCH | Freq: Every day | TRANSDERMAL | Status: DC

## 2014-04-02 NOTE — Care Management Note (Signed)
    Page 1 of 1   04/02/2014     3:02:57 PM CARE MANAGEMENT NOTE 04/02/2014  Patient:  Garrett Mitchell, Garrett Mitchell   Account Number:  [id number]  Date Initiated:  04/02/2014  Documentation initiated by:  Theophilus Kinds  Subjective/Objective Assessment:   Pt admitted from home with CP. Pt lives with his wife and will return home at discharge. Pt is independent with ADL's.     Action/Plan:   Pt to be discharged home today. Pt given number to dental clinic at [place] and instructions on getting appt. Pt verbalized understanding.   Anticipated DC Date:  04/02/2014   Anticipated DC Plan:  [place]  CM consult      Choice offered to / List presented to:             Status of service:  Completed, signed off Medicare Important Message given?   (If response is "NO", the following Medicare IM given date fields will be blank) Date Medicare IM given:   Medicare IM given by:   Date Additional Medicare IM given:   Additional Medicare IM given by:    Discharge Disposition:  HOME/SELF CARE  Per UR Regulation:    If discussed at Long Length of Stay Meetings, dates discussed:    Comments:  04/02/14 [place], RN BSN CM

## 2014-04-02 NOTE — Progress Notes (Signed)
Stress Lab Nurses Notes - [name] 04/02/2014 Reason for doing test: Chest Pain Type of test: Wille Glaser / Inpatient Rm 337 Nurse performing test: Gerrit Halls, RN Nuclear Medicine Tech: Dyanne Carrel Echo Tech: Not Applicable MD performing test: Branch/K.Armen Pickup MD: Manuella Ghazi Test explained and consent signed: Yes.   IV started: Saline lock flushed, No redness or edema and Saline lock from floor Symptoms: SOB Treatment/Intervention: None Reason test stopped: protocol completed After recovery IV was: No redness or edema and Saline Lock flushed Patient to return to [place]. Med at : 10:00 Patient discharged: Transported back to room 337 via wc Patient's Condition upon discharge was: stable Comments: During test BP 123/70 & HR 95.  Recovery BP 113/73 & HR 83.  Symptoms resolved in recovery. [name]

## 2014-04-02 NOTE — Progress Notes (Signed)
Patient ID: Garrett Mitchell, male   DOB: [birth year], 53 y.o.   MRN: [id number]     Subjective:    Chest pain improved today  Objective:   Temp:  [98 F (36.7 C)-98.6 F (37 C)] 98 F (36.7 C) (12/08 0528) Pulse Rate:  [71-88] 71 (12/08 0528) Resp:  [20] 20 (12/08 0528) BP: (119-120)/(69-80) 120/72 mmHg (12/08 0528) SpO2:  [99 %-100 %] 99 % (12/08 0528) Last BM Date: 03/30/14  Filed Weights   03/28/14 1228 03/28/14 1659  Weight: 215 lb (97.523 kg) 208 lb (94.348 kg)    Intake/Output Summary (Last 24 hours) at 04/02/14 1120 Last data filed at 04/02/14 1106  Gross per 24 hour  Intake    465 ml  Output      0 ml  Net    465 ml    Exam:  General: NAD:  Resp:CTAB  Cardiac: RRR, 2/6 systolic murmur RUSB, no JVD  QV:ZDGLOVF soft, NT, ND  MSK:no LE edema  Neuro: no focal deficits  Lab Results:  Basic Metabolic Panel:  Recent Labs Lab 03/28/14 1234 03/29/14 0433 03/30/14 0638  NA 138 141 142  K 3.3* 3.5* 3.8  CL 103 105 106  CO2 18* 25 26  GLUCOSE 138* 124* 109*  BUN 10 8 9   CREATININE 1.02 1.12 1.01  CALCIUM 9.5 8.9 8.9    Liver Function Tests:  Recent Labs Lab 03/28/14 1234 03/29/14 0433  AST 46* 210*  ALT 57* 181*  ALKPHOS 102 114  BILITOT 0.8 0.6  PROT 7.8 6.8  ALBUMIN 4.5 3.6    CBC:  Recent Labs Lab 03/28/14 1234 03/29/14 0433 03/30/14 0638  WBC 15.4* 9.2 9.0  HGB 15.9 14.0 13.1  HCT 45.5 41.5 39.5  MCV 91.9 95.4 96.6  PLT 362 299 271    Cardiac Enzymes:  Recent Labs Lab 03/31/14 1442 03/31/14 2053 04/01/14 0224  TROPONINI <0.30 <0.30 <0.30    BNP: No results for input(s): PROBNP in the last 8760 hours.  Coagulation: No results for input(s): INR in the last 168 hours.  ECG:   Medications:   Scheduled Medications: . ALPRAZolam  0.5 mg Oral TID  . amLODipine  10 mg Oral q morning - 10a  . amoxicillin-clavulanate  1 tablet Oral Q12H  . aspirin EC  81 mg Oral Daily  . cloNIDine  0.2 mg Oral BID  . heparin   5,000 Units Subcutaneous 3 times per day  . morphine  60 mg Oral Q12H  . nicotine  21 mg Transdermal Daily  . oxyCODONE-acetaminophen  1 tablet Oral Once  . potassium chloride  10 mEq Oral Daily  . rOPINIRole  0.5 mg Oral QHS  . simvastatin  20 mg Oral QHS  . sodium chloride  3 mL Intravenous Q12H  . topiramate  50 mg Oral BID  . triazolam  0.5 mg Oral QHS     Infusions:     PRN Medications:  cyclobenzaprine, morphine injection, ondansetron **OR** ondansetron (ZOFRAN) IV, sodium chloride     Assessment/Plan    1. 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 States he was followed at [facility] until the age of 11 and then released from care, has not had to see a cardiologist since. - he has had repeat echo this admit however due to computer issues over the weekend have not been able to review. 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 There is a mild subvavlular gradient due to [facility] in the setting of concentric LVH and hyperdynamic LV function. Cannot distinguish number of AV cusps, however there is no stenosis and no regurgitation. No evidence of valvular vegetation.  - echo 2011 moderate LVH, LVEF 70-75%, no WMAs. AV reported as trileaflet with no significant stenosis or regurgitation. From my reading of this echo the number of AV cusps is unclear  - no further valve testing indicated at this point. Given dynamic gradient will start beta blocker. If pertinent in future to establish exact AV morphology can consider TEE, at this time in absence of valvular malfunction no strong indication.    2. Chest pain - enzymes negative, EKG with LVH and probable strain pattern however cannot rule out ischemia.  - Lexiscan MPI without evidence of ischemia - continue CAD risk factor modification, no further cardiac testing at this time  3. Tooth  abscess - abx per primary team - blood cultures negative - there is no indication for prophylactic abx from cardiac standpoint.   No active cardiac conditions. Will sign off at this point. He will f/u with me in [place] in 2-3 months    Carlyle Dolly, M.D.

## 2014-04-02 NOTE — Progress Notes (Signed)
Discharged instructions and prescriptions given, verbalized understanding, out in stable condition ambulatory with staff.

## 2014-04-02 NOTE — Discharge Summary (Signed)
Physician Discharge Summary  Garrett Mitchell IWP:[id number] DOB: [birth year] DOA: 03/28/2014  PCP: Monico Blitz, MD  Admit date: 03/28/2014 Discharge date: 04/02/2014  Time spent: 40 minutes  Recommendations for Outpatient Follow-up:  1. PCP 1-2 weeks for evaluation of symptoms. Follow dental caries, HTN control, follow echo results.  2. [facility].   Discharge Diagnoses:  Active Problems:   Chest pain   HTN (hypertension)   Tobacco abuse   Arthritis   Hyperlipemia   Aortic valve disease   Dental caries   Chronic back pain   Hypokalemia   Abscess   Pain in the chest   Discharge Condition: stable  Diet recommendation: heart healthy  Filed Weights   03/28/14 1228 03/28/14 1659  Weight: 97.523 kg (215 lb) 94.348 kg (208 lb)    History of present illness:  Garrett Mitchell is a 53 y.o. male with medical hx hypertensive, strong family history of early coronary artery disease, who presented 03/28/14 with chest pain which started the day prior. The pain was mid lower sternal area and did not radiate significantly. It was a combination of sharp pain with dull pain, he could not specify. It was intermittent. It was not associated with sweating, nausea or dyspnea. He was a smoker of one pack of cigarettes per day. He was also concerned about right-sided jaw pain which he  had for the previous 4-5 days from painful teeth. He was known to have aortic valve problem and had been told to take antibiotics if he has surgery on his teeth. Echocardiogram done on October 2011 did not show any major abnormalities of the aortic valve.   Hospital Course:  Chest Pain Continued to have intermittent chest pain during hospitalization. Troponin negative on 2 separate cycles. ECHO pending (official reading) at discharge. 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 Cardiology opined no further valve  testing needed. See note dated 12/7. EKG abnormal: LVH and ?repolarization abnormalities, ischemia not excluded. Lexiscan on 04/02/14 without evidence of ischemia. Recommends continuation CAD risk factor modification with follow up in 3 months. Appointment scheduled with Dr Harl Bowie 3/15  Poor dentition/?dental abscess -No access to OP dental care. Unable to obtain orthopantogram. Will be discharged with 5 days augmenten to complete 10 day course for potential abscess. BC negative to date at discharge. He remained afebrile and non-toxic appearing. Has been referred to [facility] for follow up  HTN -Controlled.  Tobacco Abuse -Counseled on cessation.  Procedures:  Lexiscan 04/02/14  Consultations:  cardiology  Discharge Exam: Filed Vitals:   04/02/14 1339  BP: 110/74  Pulse: 77  Temp: 97.7 F (36.5 C)  Resp: 20    General: well nourished NAD Cardiovascular: RRR +murmur no LE edema Respiratory: normal effort BS clear bilaterally no wheeze  Discharge Instructions You were cared for by a hospitalist during your hospital stay. If you have any questions about your discharge medications or the care you received while you were in the hospital after you are discharged, you can call the unit and asked to speak with the hospitalist on call if the hospitalist that took care of you is not available. Once you are discharged, your primary care physician will handle any further medical issues. Please note that NO REFILLS for any discharge medications will be authorized once you are discharged, as it is imperative that you return to your primary care physician (or establish a relationship with a  primary care physician if you do not have one) for your aftercare needs so that they can reassess your need for medications and monitor your lab values.   Current Discharge Medication List    START taking these medications   Details  amoxicillin-clavulanate  (AUGMENTIN) 875-125 MG per tablet Take 1 tablet by mouth every 12 (twelve) hours. Qty: 10 tablet, Refills: 0    aspirin EC 81 MG EC tablet Take 1 tablet (81 mg total) by mouth daily.    metoprolol tartrate (LOPRESSOR) 12.5 mg TABS tablet Take 0.5 tablets (12.5 mg total) by mouth 2 (two) times daily. Qty: 60 tablet, Refills: 0    nicotine (NICODERM CQ - DOSED IN MG/24 HOURS) 21 mg/24hr patch Place 1 patch (21 mg total) onto the skin daily. Qty: 28 patch, Refills: 0      CONTINUE these medications which have NOT CHANGED   Details  ALPRAZolam (XANAX) 0.5 MG tablet Take 0.5 mg by mouth 3 (three) times daily. For anxiety    amLODipine (NORVASC) 10 MG tablet Take 10 mg by mouth every morning.     butalbital-acetaminophen-caffeine (FIORICET, ESGIC) 50-325-40 MG per tablet Take 1 tablet by mouth 4 (four) times daily as needed for headache. For pain    cyclobenzaprine (FLEXERIL) 10 MG tablet Take 10 mg by mouth 3 (three) times daily as needed for muscle spasms.     HYDROcodone-acetaminophen (NORCO) 10-325 MG per tablet Take 1 tablet by mouth daily. For breakthrough pain    Multiple Vitamin (MULTIVITAMIN WITH MINERALS) TABS tablet Take 1 tablet by mouth daily.    OPANA ER, CRUSH RESISTANT, 20 MG T12A Take 1 tablet by mouth every 12 (twelve) hours as needed. For pain    potassium chloride (K-DUR) 10 MEQ tablet Take 10 mEq by mouth daily.    rOPINIRole (REQUIP) 0.25 MG tablet Take 0.5 mg by mouth at bedtime.     simvastatin (ZOCOR) 20 MG tablet Take 20 mg by mouth at bedtime.    topiramate (TOPAMAX) 50 MG tablet Take 50 mg by mouth 2 (two) times daily.    traMADol (ULTRAM) 50 MG tablet Take 50-100 mg by mouth every 6 (six) hours as needed for moderate pain (for breakthrough pain).     triazolam (HALCION) 0.25 MG tablet Take 0.5 mg by mouth at bedtime.     Vitamin D, Ergocalciferol, (DRISDOL) 50000 UNITS CAPS capsule Take 50,000 Units by mouth every Monday.    cloNIDine (CATAPRES) 0.2 MG  tablet Take 0.2 mg by mouth 2 (two) times daily.       No Known Allergies Follow-up Information    Follow up with [facility], MD. Schedule an appointment as soon as possible for a visit in 1 week.   Specialty:  Internal Medicine   Why:  for evaluation of symptoms   Contact information:   [place] [address] [phone]       Please follow up.   Why:  dental clinic       The results of significant diagnostics from this hospitalization (including imaging, microbiology, ancillary and laboratory) are listed below for reference.    Significant Diagnostic Studies: Dg Chest 2 View  03/28/2014   CLINICAL DATA:  Acute chest pain since yesterday, diabetes and hypertension, smoker  EXAM: CHEST - 2 VIEW  COMPARISON:  01/29/2010  FINDINGS: minor basilar atelectasis versus scarring. Normal heart size and vascularity. Mild hyperinflation, suspect degree of COPD/emphysema. No focal pneumonia, collapse or consolidation. Negative for edema, effusion or pneumothorax. Trachea  midline.  IMPRESSION: Mild hyperinflation with basilar atelectasis versus scarring.   Electronically Signed   By: Daryll Brod M.D.   On: 03/28/2014 14:11   Nm Myocar Multi W/spect W/wall Motion / Ef  04/02/2014   CLINICAL DATA:  53 year old male with no known history of coronary artery disease referred for chest pain.  EXAM: MYOCARDIAL IMAGING WITH SPECT (REST AND PHARMACOLOGIC-STRESS)  GATED LEFT VENTRICULAR WALL MOTION STUDY  LEFT VENTRICULAR EJECTION FRACTION  TECHNIQUE: Standard myocardial SPECT imaging was performed after resting intravenous injection of 10 mCi Tc-58m sestamibi. Subsequently, intravenous infusion of Lexiscan was performed under the supervision of the Cardiology staff. At peak effect of the drug, 30 mCi Tc-1m sestamibi was injected intravenously and standard myocardial SPECT imaging was performed. Quantitative gated imaging was also performed to evaluate left ventricular wall motion, and estimate  left ventricular ejection fraction.  COMPARISON:  None.  FINDINGS: Pharmacological stress  Baseline EKG showed normal sinus rhythm with LVH and strain pattern. After injection heart rate increased in 66 beats per min up to 96 beats per min, and resting blood pressure increased from 91/74 up to 123/70. The test was stopped after injection was complete, the patient did not experience any chest pain.  Perfusion: No decreased activity in the left ventricle on stress imaging to suggest reversible ischemia or infarction.  Wall Motion: Normal left ventricular wall motion. No left ventricular dilation.  Left Ventricular Ejection Fraction: 58 %  End diastolic volume 85 ml  End systolic volume 35 ml  IMPRESSION: 1. No reversible ischemia or infarction.  2. Normal left ventricular wall motion.  3. Left ventricular ejection fraction 58%  4. Low-risk stress test findings*.  *2012 Appropriate Use Criteria for Coronary Revascularization Focused Update: J Am Coll Cardiol. 6546;50(3):546-568. http://content.airportbarriers.com.aspx?articleid=1201161   Electronically Signed   By: Carlyle Dolly   On: 04/02/2014 11:19    Microbiology: Recent Results (from the past 240 hour(s))  Blood culture (routine x 2)     Status: None   Collection Time: 03/28/14  2:38 PM  Result Value Ref Range Status   Specimen Description BLOOD RIGHT ANTECUBITAL  Final   Special Requests   Final    BOTTLES DRAWN AEROBIC AND ANAEROBIC AEB=8CC ANA=7CC   Culture NO GROWTH 5 DAYS  Final   Report Status 04/02/2014 FINAL  Final  Blood culture (routine x 2)     Status: None   Collection Time: 03/28/14  2:59 PM  Result Value Ref Range Status   Specimen Description BLOOD RIGHT HAND  Final   Special Requests BOTTLES DRAWN AEROBIC ONLY 4CC  Final   Culture NO GROWTH 5 DAYS  Final   Report Status 04/02/2014 FINAL  Final     Labs: Basic Metabolic Panel:  Recent Labs Lab 03/28/14 1234 03/29/14 0433 03/30/14 0638  NA 138 141 142  K 3.3* 3.5*  3.8  CL 103 105 106  CO2 18* 25 26  GLUCOSE 138* 124* 109*  BUN 10 8 9   CREATININE 1.02 1.12 1.01  CALCIUM 9.5 8.9 8.9   Liver Function Tests:  Recent Labs Lab 03/28/14 1234 03/29/14 0433  AST 46* 210*  ALT 57* 181*  ALKPHOS 102 114  BILITOT 0.8 0.6  PROT 7.8 6.8  ALBUMIN 4.5 3.6   No results for input(s): LIPASE, AMYLASE in the last 168 hours. No results for input(s): AMMONIA in the last 168 hours. CBC:  Recent Labs Lab 03/28/14 1234 03/29/14 0433 03/30/14 0638  WBC 15.4* 9.2 9.0  NEUTROABS 10.3*  --   --  HGB 15.9 14.0 13.1  HCT 45.5 41.5 39.5  MCV 91.9 95.4 96.6  PLT 362 299 271   Cardiac Enzymes:  Recent Labs Lab 03/28/14 2157 03/29/14 0433 03/31/14 1442 03/31/14 2053 04/01/14 0224  TROPONINI <0.30 <0.30 <0.30 <0.30 <0.30   BNP: BNP (last 3 results) No results for input(s): PROBNP in the last 8760 hours. CBG: No results for input(s): GLUCAP in the last 168 hours.     SignedRadene Gunning  [facility] 04/02/2014, 4:00 PM

## 2014-07-04 ENCOUNTER — Ambulatory Visit: Payer: Medicare Other | Admitting: Cardiology

## 2014-07-04 ENCOUNTER — Encounter: Payer: Self-pay | Admitting: Cardiology

## 2014-07-04 NOTE — Progress Notes (Unsigned)
Clinical Summary Garrett Mitchell is a 54 y.o.male seen today for follow up of the following medical problems.  1. Chest pain - admitted 03/2014 with chest pain. Troponins negative, EKG with LVH and probable strain pattern. - 03/2014 Lexiscan MPI with no ischemia, LVEF 58%  2. HTN   Past Medical History  Diagnosis Date  . History of stroke   . Arthritis   . Essential hypertension   . Hyperlipemia   . Aortic valve disease     Presumably bicuspid - limited information   . Dental caries   . Chronic back pain      No Known Allergies   Current Outpatient Prescriptions  Medication Sig Dispense Refill  . ALPRAZolam (XANAX) 0.5 MG tablet Take 0.5 mg by mouth 3 (three) times daily. For anxiety    . amLODipine (NORVASC) 10 MG tablet Take 10 mg by mouth every morning.     Marland Kitchen aspirin EC 81 MG EC tablet Take 1 tablet (81 mg total) by mouth daily.    . butalbital-acetaminophen-caffeine (FIORICET, ESGIC) 50-325-40 MG per tablet Take 1 tablet by mouth 4 (four) times daily as needed for headache. For pain    . cloNIDine (CATAPRES) 0.2 MG tablet Take 0.2 mg by mouth 2 (two) times daily.    . cyclobenzaprine (FLEXERIL) 10 MG tablet Take 10 mg by mouth 3 (three) times daily as needed for muscle spasms.     Marland Kitchen HYDROcodone-acetaminophen (NORCO) 10-325 MG per tablet Take 1 tablet by mouth daily. For breakthrough pain    . metoprolol tartrate (LOPRESSOR) 12.5 mg TABS tablet Take 0.5 tablets (12.5 mg total) by mouth 2 (two) times daily. 60 tablet 0  . Multiple Vitamin (MULTIVITAMIN WITH MINERALS) TABS tablet Take 1 tablet by mouth daily.    . nicotine (NICODERM CQ - DOSED IN MG/24 HOURS) 21 mg/24hr patch Place 1 patch (21 mg total) onto the skin daily. 28 patch 0  . OPANA ER, CRUSH RESISTANT, 20 MG T12A Take 1 tablet by mouth every 12 (twelve) hours as needed. For pain    . potassium chloride (K-DUR) 10 MEQ tablet Take 10 mEq by mouth daily.    Marland Kitchen rOPINIRole (REQUIP) 0.25 MG tablet Take 0.5 mg by  mouth at bedtime.     . simvastatin (ZOCOR) 20 MG tablet Take 20 mg by mouth at bedtime.    . topiramate (TOPAMAX) 50 MG tablet Take 50 mg by mouth 2 (two) times daily.    . traMADol (ULTRAM) 50 MG tablet Take 50-100 mg by mouth every 6 (six) hours as needed for moderate pain (for breakthrough pain).     . triazolam (HALCION) 0.25 MG tablet Take 0.5 mg by mouth at bedtime.     . Vitamin D, Ergocalciferol, (DRISDOL) 50000 UNITS CAPS capsule Take 50,000 Units by mouth every Monday.     No current facility-administered medications for this visit.     Past Surgical History  Procedure Laterality Date  . Back surgery    . Bowel resection    . Cholecystectomy    . Vasectomy       No Known Allergies    Family History  Problem Relation Age of Onset  . CAD Father     Premature disease     Social History Garrett Mitchell reports that he has been smoking Cigarettes.  He has been smoking about 1.00 pack per day. He does not have any smokeless tobacco history on file. Garrett Mitchell reports that he does not drink  alcohol.   Review of Systems CONSTITUTIONAL: No weight loss, fever, chills, weakness or fatigue.  HEENT: Eyes: No visual loss, blurred vision, double vision or yellow sclerae.No hearing loss, sneezing, congestion, runny nose or sore throat.  SKIN: No rash or itching.  CARDIOVASCULAR:  RESPIRATORY: No shortness of breath, cough or sputum.  GASTROINTESTINAL: No anorexia, nausea, vomiting or diarrhea. No abdominal pain or blood.  GENITOURINARY: No burning on urination, no polyuria NEUROLOGICAL: No headache, dizziness, syncope, paralysis, ataxia, numbness or tingling in the extremities. No change in bowel or bladder control.  MUSCULOSKELETAL: No muscle, back pain, joint pain or stiffness.  LYMPHATICS: No enlarged nodes. No history of splenectomy.  PSYCHIATRIC: No history of depression or anxiety.  ENDOCRINOLOGIC: No reports of sweating, cold or heat intolerance. No polyuria or  polydipsia.  Marland Kitchen   Physical Examination There were no vitals filed for this visit. There were no vitals filed for this visit.  Gen: resting comfortably, no acute distress HEENT: no scleral icterus, pupils equal round and reactive, no palptable cervical adenopathy,  CV Resp: Clear to auscultation bilaterally GI: abdomen is soft, non-tender, non-distended, normal bowel sounds, no hepatosplenomegaly MSK: extremities are warm, no edema.  Skin: warm, no rash Neuro:  no focal deficits Psych: appropriate affect   Diagnostic Studies 03/2014 Lexiscan IMPRESSION: 1. No reversible ischemia or infarction.  2. Normal left ventricular wall motion.  3. Left ventricular ejection fraction 58%  4. Low-risk stress test findings*.  Echo 03/2014 severe concentric LVH, LVEF hyperdynamic 70-75%, no WMAs, grade II diastolic dysfunction. There is a mild subvavlular gradient due to [facility] in the setting of concentric LVH and hyperdynamic LV function. Cannot distinguish number of AV cusps, however there is no stenosis and no regurgitation. No evidence of valvular vegetation.   Echo 2011 moderate LVH, LVEF 70-75%, no WMAs. AV reported as trileaflet with no significant stenosis or regurgitation Assessment and Plan        Arnoldo Lenis, M.D., F.A.C.C.

## 2015-04-30 ENCOUNTER — Emergency Department (HOSPITAL_COMMUNITY): Payer: Medicare Other

## 2015-04-30 ENCOUNTER — Emergency Department (HOSPITAL_COMMUNITY)
Admission: EM | Admit: 2015-04-30 | Discharge: 2015-04-30 | Disposition: A | Discharge status: Home / Self Care | Payer: Medicare Other | Attending: Emergency Medicine | Admitting: Emergency Medicine

## 2015-04-30 ENCOUNTER — Encounter (HOSPITAL_COMMUNITY): Payer: Self-pay | Admitting: Emergency Medicine

## 2015-04-30 DIAGNOSIS — E785 Hyperlipidemia, unspecified: Secondary | ICD-10-CM | POA: Insufficient documentation

## 2015-04-30 DIAGNOSIS — K089 Disorder of teeth and supporting structures, unspecified: Secondary | ICD-10-CM

## 2015-04-30 DIAGNOSIS — M1990 Unspecified osteoarthritis, unspecified site: Secondary | ICD-10-CM | POA: Diagnosis not present

## 2015-04-30 DIAGNOSIS — R0789 Other chest pain: Secondary | ICD-10-CM | POA: Insufficient documentation

## 2015-04-30 DIAGNOSIS — I10 Essential (primary) hypertension: Secondary | ICD-10-CM | POA: Diagnosis not present

## 2015-04-30 DIAGNOSIS — G8929 Other chronic pain: Secondary | ICD-10-CM | POA: Diagnosis not present

## 2015-04-30 DIAGNOSIS — F17210 Nicotine dependence, cigarettes, uncomplicated: Secondary | ICD-10-CM | POA: Diagnosis not present

## 2015-04-30 DIAGNOSIS — Z8673 Personal history of transient ischemic attack (TIA), and cerebral infarction without residual deficits: Secondary | ICD-10-CM | POA: Diagnosis not present

## 2015-04-30 DIAGNOSIS — K0889 Other specified disorders of teeth and supporting structures: Secondary | ICD-10-CM | POA: Insufficient documentation

## 2015-04-30 DIAGNOSIS — Z7982 Long term (current) use of aspirin: Secondary | ICD-10-CM | POA: Insufficient documentation

## 2015-04-30 DIAGNOSIS — R079 Chest pain, unspecified: Secondary | ICD-10-CM | POA: Diagnosis present

## 2015-04-30 DIAGNOSIS — Z79899 Other long term (current) drug therapy: Secondary | ICD-10-CM | POA: Diagnosis not present

## 2015-04-30 LAB — COMPREHENSIVE METABOLIC PANEL
ALBUMIN: 3.7 g/dL (ref 3.5–5.0)
ALT: 32 U/L (ref 17–63)
ANION GAP: 7 (ref 5–15)
AST: 24 U/L (ref 15–41)
Alkaline Phosphatase: 73 U/L (ref 38–126)
BUN: 14 mg/dL (ref 6–20)
CALCIUM: 8.6 mg/dL — AB (ref 8.9–10.3)
CO2: 23 mmol/L (ref 22–32)
Chloride: 111 mmol/L (ref 101–111)
Creatinine, Ser: 0.87 mg/dL (ref 0.61–1.24)
GFR calc Af Amer: >60 mL/min (ref >60)
GFR calc non Af Amer: >60 mL/min (ref >60)
Glucose, Bld: 127 mg/dL — ABNORMAL HIGH (ref 65–99)
POTASSIUM: 4.1 mmol/L (ref 3.5–5.1)
SODIUM: 141 mmol/L (ref 135–145)
TOTAL PROTEIN: 6.4 g/dL — AB (ref 6.5–8.1)
Total Bilirubin: 0.3 mg/dL (ref 0.3–1.2)

## 2015-04-30 LAB — CBC WITH DIFFERENTIAL/PLATELET
BASOS ABS: 0 10*3/uL (ref 0.0–0.1)
BASOS PCT: 0 %
EOS ABS: 0.3 10*3/uL (ref 0.0–0.7)
Eosinophils Relative: 3 %
HCT: 39 % (ref 39.0–52.0)
HEMOGLOBIN: 13 g/dL (ref 13.0–17.0)
LYMPHS ABS: 3.6 10*3/uL (ref 0.7–4.0)
Lymphocytes Relative: 36 %
MCH: 32.5 pg (ref 26.0–34.0)
MCHC: 33.3 g/dL (ref 30.0–36.0)
MCV: 97.5 fL (ref 78.0–100.0)
Monocytes Absolute: 0.8 10*3/uL (ref 0.1–1.0)
Monocytes Relative: 8 %
NEUTROS PCT: 53 %
Neutro Abs: 5.3 10*3/uL (ref 1.7–7.7)
PLATELETS: 262 10*3/uL (ref 150–400)
RBC: 4 MIL/uL — AB (ref 4.22–5.81)
RDW: 13.3 % (ref 11.5–15.5)
WBC: 10.1 10*3/uL (ref 4.0–10.5)

## 2015-04-30 LAB — URINALYSIS, ROUTINE W REFLEX MICROSCOPIC
Bilirubin Urine: NEGATIVE
Glucose, UA: NEGATIVE mg/dL
Hgb urine dipstick: NEGATIVE
Ketones, ur: NEGATIVE mg/dL
LEUKOCYTES UA: NEGATIVE
Nitrite: NEGATIVE
Protein, ur: NEGATIVE mg/dL
pH: 6 (ref 5.0–8.0)

## 2015-04-30 LAB — SALICYLATE LEVEL: Salicylate Lvl: <4 mg/dL (ref 2.8–30.0)

## 2015-04-30 LAB — TROPONIN I

## 2015-04-30 LAB — ETHANOL: Alcohol, Ethyl (B): <5 mg/dL (ref <5)

## 2015-04-30 LAB — RAPID URINE DRUG SCREEN, HOSP PERFORMED
AMPHETAMINES: NOT DETECTED
Barbiturates: NOT DETECTED
Benzodiazepines: POSITIVE — AB
COCAINE: NOT DETECTED
OPIATES: POSITIVE — AB
Tetrahydrocannabinol: NOT DETECTED

## 2015-04-30 LAB — ACETAMINOPHEN LEVEL: Acetaminophen (Tylenol), Serum: <10 ug/mL — ABNORMAL LOW (ref 10–30)

## 2015-04-30 MED ORDER — ASPIRIN 81 MG PO CHEW: 324.0000 mg | CHEWABLE_TABLET | Freq: Once | ORAL | Status: DC

## 2015-04-30 MED ORDER — ACETAMINOPHEN 500 MG PO TABS
1000.0000 mg | ORAL_TABLET | Freq: Once | ORAL | Status: AC
  Administered 2015-04-30: 1000 mg via ORAL
  Filled 2015-04-30: qty 2

## 2015-04-30 MED ORDER — NITROGLYCERIN 2 % TD OINT
1.0000 [in_us] | TOPICAL_OINTMENT | Freq: Once | TRANSDERMAL | Status: AC
  Administered 2015-04-30: 1 [in_us] via TOPICAL
  Filled 2015-04-30: qty 1

## 2015-04-30 NOTE — Discharge Instructions (Signed)
You have plenty of pain medication to take at home. Only take it as prescribed. You need to follow-up with the oral surgeon about getting your teeth pulled since they are bothering you so much. Recheck if you get fever or swelling of your face or swelling of you gums from your teeth.

## 2015-04-30 NOTE — ED Notes (Signed)
Pt c/o chest pain for a few days that started in abd that has gradually moved to chest and left arm. Pt also c/o dental pain. Ems was called out for unresponsiveness due to spouse being concerned of pain med overdose which the pt denies.

## 2015-04-30 NOTE — ED Provider Notes (Signed)
CSN: [id number]     Arrival date & time 04/30/15  0355 History   First MD Initiated Contact with Patient 04/30/15 [phone]   Chief Complaint  Patient presents with  . Chest Pain     (Consider location/radiation/quality/duration/timing/severity/associated sxs/prior Treatment) HPI patient states when asked why he is here, " It's been going on for a while". He states he has been having dental problems for at least a year. He states he went to see a dentist that he went to school with who referred him to an oral Psychologist, sport and exercise. However for some reason he has not had his teeth pulled. Patient's speech is very difficult to understand, he is mumbling and starts rambling on about things that do not apply to what were talking about, such as which high school he and the dentist went to.  He states about 9:30 PM he started having a dull pressure in his lower sternum. He states he took Maalox which has helped in the past but it didn't help tonight. He denies nausea, vomiting, diaphoresis. He states the pain did radiate to his left arm which it is done before. He states he was admitted to the hospital about a year ago for the same thing and was seen by a cardiologist in the hospital.  Evidently per EMS wife called because she found him "with his face in a plate of food" and was concerned he had overdosed on his pain medication. EMS noted he had no debris on his face or hair and I also observed the same. He states his wife "exaggerates".  It was noted in the patient's history he had a prior stroke. He states he had "no symptoms" however he did report that he had some changes in his vision which did not improve.  PCP Dr Manuella Ghazi Pain management Dr. Merlene Laughter  Past Medical History  Diagnosis Date  . History of stroke   . Arthritis   . Essential hypertension   . Hyperlipemia   . Aortic valve disease     Presumably bicuspid - limited information   . Dental caries   . Chronic back pain    Past Surgical History    Procedure Laterality Date  . Back surgery    . Bowel resection    . Cholecystectomy    . Vasectomy     Family History  Problem Relation Age of Onset  . CAD Father     Premature disease   Social History  Substance Use Topics  . Smoking status: Current Every Day Smoker -- 1.00 packs/day    Types: Cigarettes  . Smokeless tobacco: None  . Alcohol Use: No  Lives at home Lives with spouse On disability since 2001 when he fell and "broke my back in 3 places"  Review of Systems  All other systems reviewed and are negative.     Allergies  Review of patient's allergies indicates no known allergies.  Home Medications   Prior to Admission medications   Medication Sig Start Date End Date Taking? Authorizing Provider  ALPRAZolam Duanne Moron) 0.5 MG tablet Take 0.5 mg by mouth 3 (three) times daily. For anxiety 02/27/14   Historical Provider, MD  amLODipine (NORVASC) 10 MG tablet Take 10 mg by mouth every morning.  03/27/14   Historical Provider, MD  aspirin EC 81 MG EC tablet Take 1 tablet (81 mg total) by mouth daily. 04/02/14   Radene Gunning, NP  butalbital-acetaminophen-caffeine (FIORICET, ESGIC) [phone] MG per tablet Take 1 tablet by mouth 4 (four) times  daily as needed for headache. For pain 03/27/14   Historical Provider, MD  cloNIDine (CATAPRES) 0.2 MG tablet Take 0.2 mg by mouth 2 (two) times daily. 03/27/14   Historical Provider, MD  cyclobenzaprine (FLEXERIL) 10 MG tablet Take 10 mg by mouth 3 (three) times daily as needed for muscle spasms.  03/27/14   Historical Provider, MD  HYDROcodone-acetaminophen (NORCO) 10-325 MG per tablet Take 1 tablet by mouth daily. For breakthrough pain 03/01/14   Historical Provider, MD  metoprolol tartrate (LOPRESSOR) 12.5 mg TABS tablet Take 0.5 tablets (12.5 mg total) by mouth 2 (two) times daily. 04/02/14   Radene Gunning, NP  Multiple Vitamin (MULTIVITAMIN WITH MINERALS) TABS tablet Take 1 tablet by mouth daily.    Historical Provider, MD  nicotine  (NICODERM CQ - DOSED IN MG/24 HOURS) 21 mg/24hr patch Place 1 patch (21 mg total) onto the skin daily. 04/02/14   Radene Gunning, NP  OPANA ER, CRUSH RESISTANT, 20 MG T12A Take 1 tablet by mouth every 12 (twelve) hours as needed. For pain 03/01/14   Historical Provider, MD  potassium chloride (K-DUR) 10 MEQ tablet Take 10 mEq by mouth daily.    Historical Provider, MD  rOPINIRole (REQUIP) 0.25 MG tablet Take 0.5 mg by mouth at bedtime.  03/27/14   Historical Provider, MD  simvastatin (ZOCOR) 20 MG tablet Take 20 mg by mouth at bedtime. 03/27/14   Historical Provider, MD  topiramate (TOPAMAX) 50 MG tablet Take 50 mg by mouth 2 (two) times daily. 03/27/14   Historical Provider, MD  traMADol (ULTRAM) 50 MG tablet Take 50-100 mg by mouth every 6 (six) hours as needed for moderate pain (for breakthrough pain).  02/27/14   Historical Provider, MD  triazolam (HALCION) 0.25 MG tablet Take 0.5 mg by mouth at bedtime.  02/27/14   Historical Provider, MD  Vitamin D, Ergocalciferol, (DRISDOL) 50000 UNITS CAPS capsule Take 50,000 Units by mouth every Monday.    Historical Provider, MD   BP 121/88 mmHg  Pulse 80  Temp(Src) 98.1 F (36.7 C) (Oral)  Resp 17  Ht 5\' 11"  (1.803 m)  Wt 205 lb (92.987 kg)  BMI 28.60 kg/m2  SpO2 100%  Vital signs normal   Physical Exam  Constitutional: He is oriented to person, place, and time. He appears well-developed and well-nourished.  Non-toxic appearance. He does not appear ill. No distress.  HENT:  Head: Normocephalic and atraumatic.  Right Ear: External ear normal.  Left Ear: External ear normal.  Nose: Nose normal. No mucosal edema or rhinorrhea.  Mouth/Throat: Oropharynx is clear and moist and mucous membranes are normal. No dental abscesses or uvula swelling.  Speech is very slurred and hard to understand. Patient's teeth noted to have a lot of tartar without obvious large cavities.  Eyes: Conjunctivae and EOM are normal. Pupils are equal, round, and reactive to light.    Neck: Normal range of motion and full passive range of motion without pain. Neck supple.  Cardiovascular: Normal rate, regular rhythm and normal heart sounds.  Exam reveals no gallop and no friction rub.   No murmur heard. Pulmonary/Chest: Effort normal and breath sounds normal. No respiratory distress. He has no wheezes. He has no rhonchi. He has no rales. He exhibits no tenderness and no crepitus.    Abdominal: Soft. Normal appearance and bowel sounds are normal. He exhibits no distension. There is no tenderness. There is no rebound and no guarding.  Musculoskeletal: Normal range of motion. He exhibits no edema or  tenderness.  Moves all extremities well.   Neurological: He is alert and oriented to person, place, and time. He has normal strength. No cranial nerve deficit.  No pronator drift, grips are equal, no lower extremity weakness  Skin: Skin is warm, dry and intact. No rash noted. No erythema. No pallor.  Psychiatric: He has a normal mood and affect. His speech is normal and behavior is normal. His mood appears not anxious.  Nursing note and vitals reviewed.   ED Course  Procedures (including critical care time)  Medications  aspirin chewable tablet 324 mg (324 mg Oral Not Given 04/30/15 0426)  nitroGLYCERIN (NITROGLYN) 2 % ointment 1 inch (1 inch Topical Given 04/30/15 0434)  acetaminophen (TYLENOL) tablet 1,000 mg (1,000 mg Oral Given 04/30/15 0434)   Patient immediately starts asking me for pain medicine even before we finished his interview. He was given aspirin to chew and nitroglycerin on his chest for his chest pain and acetaminophen to prevent a nitroglycerin headache.  Patient was rechecked at 6:45 AM. He only wants to talk about his teeth and he's concerned that he may have infection in his teeth. He does not even mention his chest pain. Patient again verifies that his speech is unchanged and he mumbles because of his teeth.    Review of the [place] shows  patient gets #60 Opana ER 20 mg tablets monthly last filled December 28, #30 hydrocodone 10/325 mg tablets last filled December 28, #180 tramadol 50 mg tablets last filled December 29, #60 resume with 25 mg tablets last filled December 29, and #85 alprazolam 0.5 mg tablets last filled December 29. These are all prescribed by Dr. Merlene Laughter.  Labs Review Results for orders placed or performed during the hospital encounter of 04/30/15  Comprehensive metabolic panel  Result Value Ref Range   Sodium 141 135 - 145 mmol/L   Potassium 4.1 3.5 - 5.1 mmol/L   Chloride 111 101 - 111 mmol/L   CO2 23 22 - 32 mmol/L   Glucose, Bld 127 (H) 65 - 99 mg/dL   BUN 14 6 - 20 mg/dL   Creatinine, Ser 0.87 0.61 - 1.24 mg/dL   Calcium 8.6 (L) 8.9 - 10.3 mg/dL   Total Protein 6.4 (L) 6.5 - 8.1 g/dL   Albumin 3.7 3.5 - 5.0 g/dL   AST 24 15 - 41 U/L   ALT 32 17 - 63 U/L   Alkaline Phosphatase 73 38 - 126 U/L   Total Bilirubin 0.3 0.3 - 1.2 mg/dL   GFR calc non Af Amer >60 >60 mL/min   GFR calc Af Amer >60 >60 mL/min   Anion gap 7 5 - 15  Ethanol  Result Value Ref Range   Alcohol, Ethyl (B) <5 <5 mg/dL  CBC with Differential  Result Value Ref Range   WBC 10.1 4.0 - 10.5 K/uL   RBC 4.00 (L) 4.22 - 5.81 MIL/uL   Hemoglobin 13.0 13.0 - 17.0 g/dL   HCT 39.0 39.0 - 52.0 %   MCV 97.5 78.0 - 100.0 fL   MCH 32.5 26.0 - 34.0 pg   MCHC 33.3 30.0 - 36.0 g/dL   RDW 13.3 11.5 - 15.5 %   Platelets 262 150 - 400 K/uL   Neutrophils Relative % 53 %   Neutro Abs 5.3 1.7 - 7.7 K/uL   Lymphocytes Relative 36 %   Lymphs Abs 3.6 0.7 - 4.0 K/uL   Monocytes Relative 8 %   Monocytes Absolute 0.8 0.1 -  1.0 K/uL   Eosinophils Relative 3 %   Eosinophils Absolute 0.3 0.0 - 0.7 K/uL   Basophils Relative 0 %   Basophils Absolute 0.0 0.0 - 0.1 K/uL  Urine rapid drug screen (hosp performed)  Result Value Ref Range   Opiates POSITIVE (A) NONE DETECTED   Cocaine NONE DETECTED NONE DETECTED   Benzodiazepines POSITIVE (A) NONE  DETECTED   Amphetamines NONE DETECTED NONE DETECTED   Tetrahydrocannabinol NONE DETECTED NONE DETECTED   Barbiturates NONE DETECTED NONE DETECTED  Acetaminophen level  Result Value Ref Range   Acetaminophen (Tylenol), Serum <10 (L) 10 - 30 ug/mL  Salicylate level  Result Value Ref Range   Salicylate Lvl 123456 2.8 - 30.0 mg/dL  Urinalysis, Routine w reflex microscopic  Result Value Ref Range   Color, Urine YELLOW YELLOW   APPearance CLEAR CLEAR   Specific Gravity, Urine >1.030 (H) 1.005 - 1.030   pH 6.0 5.0 - 8.0   Glucose, UA NEGATIVE NEGATIVE mg/dL   Hgb urine dipstick NEGATIVE NEGATIVE   Bilirubin Urine NEGATIVE NEGATIVE   Ketones, ur NEGATIVE NEGATIVE mg/dL   Protein, ur NEGATIVE NEGATIVE mg/dL   Nitrite NEGATIVE NEGATIVE   Leukocytes, UA NEGATIVE NEGATIVE  Troponin I  Result Value Ref Range   Troponin I <0.03 <0.031 ng/mL    Laboratory interpretation all normal except +UDS    Imaging Review Dg Chest Port 1 View  04/30/2015  CLINICAL DATA:  Acute onset of generalized chest pain. Initial encounter. EXAM: PORTABLE CHEST 1 VIEW COMPARISON:  Chest radiograph performed 03/28/2014 FINDINGS: The lungs are hypoexpanded. Mild vascular crowding and vascular congestion are seen. Mild bilateral atelectasis noted. No pleural effusion or pneumothorax is identified. The cardiomediastinal silhouette is enlarged. No acute osseous abnormalities are seen. IMPRESSION: Mild vascular congestion and cardiomegaly. Lungs hypoexpanded, with mild bilateral atelectasis. Electronically Signed   By: Garald Balding M.D.   On: 04/30/2015 04:59   I have personally reviewed and evaluated these images and lab results as part of my medical decision-making.   EKG Interpretation   Date/Time:  Wednesday April 30 2015 03:59:38 EST Ventricular Rate:  75 PR Interval:  202 QRS Duration: 84 QT Interval:  416 QTC Calculation: 465 R Axis:   44 Text Interpretation:  Sinus rhythm Borderline prolonged PR interval    Probable left atrial enlargement Abnormal R-wave progression, early  transition Repol abnrm, prob ischemia, anterolateral lds Baseline wander  in lead(s) V1 No significant change since last tracing 31 Mar 2014  Confirmed by Dodd Schmid  MD-I, Yasir Kitner ([id number]) on 04/30/2015 4:02:09 AM      MDM   Final diagnoses:  Atypical chest pain  Chronic dental pain  Chronic pain   Plan discharge  Rolland Porter, MD, Barbette Or, MD 04/30/15 [phone]

## 2015-06-07 ENCOUNTER — Emergency Department (HOSPITAL_COMMUNITY)
Admission: EM | Admit: 2015-06-07 | Discharge: 2015-06-07 | Disposition: A | Discharge status: Home / Self Care | Payer: Medicare Other | Attending: Emergency Medicine | Admitting: Emergency Medicine

## 2015-06-07 ENCOUNTER — Emergency Department (HOSPITAL_COMMUNITY): Payer: Medicare Other

## 2015-06-07 ENCOUNTER — Encounter (HOSPITAL_COMMUNITY): Payer: Self-pay | Admitting: Emergency Medicine

## 2015-06-07 DIAGNOSIS — I10 Essential (primary) hypertension: Secondary | ICD-10-CM | POA: Diagnosis not present

## 2015-06-07 DIAGNOSIS — E785 Hyperlipidemia, unspecified: Secondary | ICD-10-CM | POA: Diagnosis not present

## 2015-06-07 DIAGNOSIS — K029 Dental caries, unspecified: Secondary | ICD-10-CM | POA: Diagnosis not present

## 2015-06-07 DIAGNOSIS — Z79899 Other long term (current) drug therapy: Secondary | ICD-10-CM | POA: Insufficient documentation

## 2015-06-07 DIAGNOSIS — K0889 Other specified disorders of teeth and supporting structures: Secondary | ICD-10-CM | POA: Diagnosis present

## 2015-06-07 DIAGNOSIS — M1990 Unspecified osteoarthritis, unspecified site: Secondary | ICD-10-CM | POA: Insufficient documentation

## 2015-06-07 DIAGNOSIS — R110 Nausea: Secondary | ICD-10-CM | POA: Insufficient documentation

## 2015-06-07 DIAGNOSIS — Z7982 Long term (current) use of aspirin: Secondary | ICD-10-CM | POA: Diagnosis not present

## 2015-06-07 DIAGNOSIS — K0381 Cracked tooth: Secondary | ICD-10-CM | POA: Diagnosis not present

## 2015-06-07 DIAGNOSIS — G8929 Other chronic pain: Secondary | ICD-10-CM | POA: Diagnosis not present

## 2015-06-07 DIAGNOSIS — F17210 Nicotine dependence, cigarettes, uncomplicated: Secondary | ICD-10-CM | POA: Diagnosis not present

## 2015-06-07 DIAGNOSIS — R509 Fever, unspecified: Secondary | ICD-10-CM | POA: Insufficient documentation

## 2015-06-07 DIAGNOSIS — Z8673 Personal history of transient ischemic attack (TIA), and cerebral infarction without residual deficits: Secondary | ICD-10-CM | POA: Diagnosis not present

## 2015-06-07 LAB — BASIC METABOLIC PANEL
Anion gap: 9 (ref 5–15)
BUN: 6 mg/dL (ref 6–20)
CO2: 20 mmol/L — AB (ref 22–32)
CREATININE: 0.87 mg/dL (ref 0.61–1.24)
Calcium: 9.5 mg/dL (ref 8.9–10.3)
Chloride: 110 mmol/L (ref 101–111)
GLUCOSE: 115 mg/dL — AB (ref 65–99)
Potassium: 4 mmol/L (ref 3.5–5.1)
Sodium: 139 mmol/L (ref 135–145)

## 2015-06-07 LAB — CBC WITH DIFFERENTIAL/PLATELET
Basophils Absolute: 0 10*3/uL (ref 0.0–0.1)
Basophils Relative: 0 %
Eosinophils Absolute: 0.1 10*3/uL (ref 0.0–0.7)
Eosinophils Relative: 1 %
HEMATOCRIT: 47.3 % (ref 39.0–52.0)
Hemoglobin: 16.3 g/dL (ref 13.0–17.0)
Lymphocytes Relative: 30 %
Lymphs Abs: 3 10*3/uL (ref 0.7–4.0)
MCH: 33.1 pg (ref 26.0–34.0)
MCHC: 34.5 g/dL (ref 30.0–36.0)
MCV: 95.9 fL (ref 78.0–100.0)
MONO ABS: 0.6 10*3/uL (ref 0.1–1.0)
MONOS PCT: 6 %
Neutro Abs: 6.3 10*3/uL (ref 1.7–7.7)
Neutrophils Relative %: 63 %
Platelets: 319 10*3/uL (ref 150–400)
RBC: 4.93 MIL/uL (ref 4.22–5.81)
RDW: 13.2 % (ref 11.5–15.5)
WBC: 9.9 10*3/uL (ref 4.0–10.5)

## 2015-06-07 MED ORDER — SODIUM CHLORIDE 0.9 % IV BOLUS (SEPSIS)
1000.0000 mL | Freq: Once | INTRAVENOUS | Status: AC
  Administered 2015-06-07: 1000 mL via INTRAVENOUS

## 2015-06-07 MED ORDER — CLINDAMYCIN HCL 150 MG PO CAPS: 450.0000 mg | ORAL_CAPSULE | Freq: Three times a day (TID) | ORAL | Status: DC

## 2015-06-07 MED ORDER — CLINDAMYCIN PHOSPHATE 600 MG/50ML IV SOLN
600.0000 mg | Freq: Once | INTRAVENOUS | Status: AC
  Administered 2015-06-07: 600 mg via INTRAVENOUS
  Filled 2015-06-07: qty 50

## 2015-06-07 MED ORDER — IBUPROFEN 800 MG PO TABS: 800.0000 mg | ORAL_TABLET | Freq: Three times a day (TID) | ORAL | Status: DC

## 2015-06-07 MED ORDER — CLONIDINE HCL 0.1 MG PO TABS
0.2000 mg | ORAL_TABLET | Freq: Two times a day (BID) | ORAL | Status: DC
  Administered 2015-06-07: 0.2 mg via ORAL
  Filled 2015-06-07: qty 2

## 2015-06-07 MED ORDER — IOHEXOL 300 MG/ML  SOLN
75.0000 mL | Freq: Once | INTRAMUSCULAR | Status: AC | PRN
  Administered 2015-06-07: 75 mL via INTRAVENOUS

## 2015-06-07 MED ORDER — ALPRAZOLAM 0.25 MG PO TABS
0.5000 mg | ORAL_TABLET | Freq: Three times a day (TID) | ORAL | Status: DC
  Administered 2015-06-07: 0.5 mg via ORAL
  Filled 2015-06-07: qty 2

## 2015-06-07 MED ORDER — OXYCODONE-ACETAMINOPHEN 5-325 MG PO TABS
2.0000 | ORAL_TABLET | Freq: Once | ORAL | Status: AC
  Administered 2015-06-07: 2 via ORAL
  Filled 2015-06-07: qty 2

## 2015-06-07 MED ORDER — LIDOCAINE VISCOUS 2 % MT SOLN: 20.0000 mL | OROMUCOSAL | Status: DC | PRN

## 2015-06-07 NOTE — ED Provider Notes (Signed)
CSN: [id number]     Arrival date & time 06/07/15  1320 History   None    Chief Complaint  Patient presents with  . Dental Pain     (Consider location/radiation/quality/duration/timing/severity/associated sxs/prior Treatment) HPI   Garrett Mitchell is a 55 y.o. male, with a history of extensive dental caries, bicuspid aortic valve, CVA, and hypertension, presenting to the ED with upper and lower dental pain for the last year. Pt states he has known need for 12 tooth extractions due to severe decay. Pt has been evaluated by Zipporah Plants, DDS, who strongly recommends tooth extraction. Pt indicates that he can not afford tooth extraction. Pt states he has been taking PCN and Amoxicillin for the past year as well. He states, "The antibiotics are starting to make me sick." Pt rates his pain at 7/10, stabbing/aching, non-radiating, located bilaterally and upper and lower jaw. Pt also endorses nausea, oral swelling, and subjective fever intermittently. Pt states that when he goes off the antibiotics, his whole mouth and jaws begin to swell, inhibiting his swallowing and breathing. Patient's wife is very concerned for the patient's health. Patient adds that he has been seen by Diona Browner, oral surgeon, who told the patient that he would be willing to extract the patient's teeth, but due to insurance reasons would need a referral coming from the hospital. Pt denies vomiting, difficulty swallowing or breathing, or any other complaints.     Past Medical History  Diagnosis Date  . History of stroke   . Arthritis   . Essential hypertension   . Hyperlipemia   . Aortic valve disease     Presumably bicuspid - limited information   . Dental caries   . Chronic back pain    Past Surgical History  Procedure Laterality Date  . Back surgery    . Bowel resection    . Cholecystectomy    . Vasectomy     Family History  Problem Relation Age of Onset  . CAD Father     Premature disease   Social History   Substance Use Topics  . Smoking status: Current Every Day Smoker -- 1.00 packs/day    Types: Cigarettes  . Smokeless tobacco: None  . Alcohol Use: No    Review of Systems  Constitutional: Negative for fever, chills and diaphoresis.  HENT: Positive for dental problem.   Respiratory: Negative for shortness of breath.   Cardiovascular: Negative for chest pain.  Gastrointestinal: Positive for nausea. Negative for vomiting and abdominal pain.  All other systems reviewed and are negative.     Allergies  Review of patient's allergies indicates no known allergies.  Home Medications   Prior to Admission medications   Medication Sig Start Date End Date Taking? Authorizing Provider  ALPRAZolam Duanne Moron) 0.5 MG tablet Take 0.5 mg by mouth 3 (three) times daily. For anxiety 02/27/14   Historical Provider, MD  amLODipine (NORVASC) 10 MG tablet Take 10 mg by mouth every morning.  03/27/14   Historical Provider, MD  aspirin EC 81 MG EC tablet Take 1 tablet (81 mg total) by mouth daily. 04/02/14   Radene Gunning, NP  butalbital-acetaminophen-caffeine (FIORICET, ESGIC) [phone] MG per tablet Take 1 tablet by mouth 4 (four) times daily as needed for headache. For pain 03/27/14   Historical Provider, MD  clindamycin (CLEOCIN) 150 MG capsule Take 3 capsules (450 mg total) by mouth 3 (three) times daily. 06/07/15   Adrean Findlay C Joley Utecht, PA-C  cloNIDine (CATAPRES) 0.2 MG tablet Take 0.2  mg by mouth 2 (two) times daily. 03/27/14   Historical Provider, MD  cyclobenzaprine (FLEXERIL) 10 MG tablet Take 10 mg by mouth 3 (three) times daily as needed for muscle spasms.  03/27/14   Historical Provider, MD  HYDROcodone-acetaminophen (NORCO) 10-325 MG per tablet Take 1 tablet by mouth daily. For breakthrough pain 03/01/14   Historical Provider, MD  ibuprofen (ADVIL,MOTRIN) 800 MG tablet Take 1 tablet (800 mg total) by mouth 3 (three) times daily. 06/07/15   Tityana Pagan C Diasia Henken, PA-C  lidocaine (XYLOCAINE) 2 % solution Use as directed 20  mLs in the mouth or throat as needed for mouth pain. 06/07/15   Margaretann Abate C Theora Vankirk, PA-C  metoprolol tartrate (LOPRESSOR) 12.5 mg TABS tablet Take 0.5 tablets (12.5 mg total) by mouth 2 (two) times daily. 04/02/14   Radene Gunning, NP  Multiple Vitamin (MULTIVITAMIN WITH MINERALS) TABS tablet Take 1 tablet by mouth daily.    Historical Provider, MD  nicotine (NICODERM CQ - DOSED IN MG/24 HOURS) 21 mg/24hr patch Place 1 patch (21 mg total) onto the skin daily. 04/02/14   Radene Gunning, NP  OPANA ER, CRUSH RESISTANT, 20 MG T12A Take 1 tablet by mouth every 12 (twelve) hours as needed. For pain 03/01/14   Historical Provider, MD  potassium chloride (K-DUR) 10 MEQ tablet Take 10 mEq by mouth daily.    Historical Provider, MD  rOPINIRole (REQUIP) 0.25 MG tablet Take 0.5 mg by mouth at bedtime.  03/27/14   Historical Provider, MD  simvastatin (ZOCOR) 20 MG tablet Take 20 mg by mouth at bedtime. 03/27/14   Historical Provider, MD  topiramate (TOPAMAX) 50 MG tablet Take 50 mg by mouth 2 (two) times daily. 03/27/14   Historical Provider, MD  traMADol (ULTRAM) 50 MG tablet Take 50-100 mg by mouth every 6 (six) hours as needed for moderate pain (for breakthrough pain).  02/27/14   Historical Provider, MD  triazolam (HALCION) 0.25 MG tablet Take 0.5 mg by mouth at bedtime.  02/27/14   Historical Provider, MD  Vitamin D, Ergocalciferol, (DRISDOL) 50000 UNITS CAPS capsule Take 50,000 Units by mouth every Monday.    Historical Provider, MD   BP 135/88 mmHg  Pulse 79  Temp(Src) 97.6 F (36.4 C) (Oral)  Resp 18  Ht 5\' 11"  (1.803 m)  Wt 94.076 kg  BMI 28.94 kg/m2  SpO2 99% Physical Exam  Constitutional: He appears well-developed and well-nourished. No distress.  HENT:  Head: Normocephalic and atraumatic.  At least five molars and premolars on each side of the bottom jaw with broken teeth and extensive dental caries. Swelling and tenderness to both buccal surfaces. Extensive, severe dental caries throughout patient's  remaining teeth. Swelling noted to patient's submandibular region bilaterally, but no swelling sublingual or submental. No swelling extending down into the tissues of the neck. No trismus.  Eyes: Conjunctivae are normal. Pupils are equal, round, and reactive to light.  Neck: Normal range of motion. Neck supple. No tracheal deviation present.  Cardiovascular: Normal rate, regular rhythm, normal heart sounds and intact distal pulses.   Pulmonary/Chest: Effort normal and breath sounds normal. No respiratory distress.  No increased work of breathing. Patient is able to speak in full sentences without difficulty.  Abdominal: Soft. Bowel sounds are normal. There is no tenderness. There is no guarding.  Musculoskeletal: He exhibits no edema or tenderness.  Lymphadenopathy:    He has no cervical adenopathy.  Neurological: He is alert.  Skin: Skin is warm and dry. He is not diaphoretic.  Nursing note and vitals reviewed.   ED Course  Procedures (including critical care time) Labs Review Labs Reviewed  BASIC METABOLIC PANEL - Abnormal; Notable for the following:    CO2 20 (*)    Glucose, Bld 115 (*)    All other components within normal limits  CBC WITH DIFFERENTIAL/PLATELET    Imaging Review Ct Soft Tissue Neck W Contrast  06/07/2015  CLINICAL DATA:  Bilateral jaw and throat swelling for 6 weeks. EXAM: CT NECK WITH CONTRAST TECHNIQUE: Multidetector CT imaging of the neck was performed using the standard protocol following the bolus administration of intravenous contrast. CONTRAST:  63mL OMNIPAQUE IOHEXOL 300 MG/ML  SOLN COMPARISON:  CT head without contrast 02/02/2010 FINDINGS: Pharynx and larynx: There is mild prominence of the palatine tonsils bilaterally. No discrete mass lesion is present. The parapharyngeal fat is clear. The valleculae is within normal limits. Vocal cords are midline and symmetric. Salivary glands: The parotid and submandibular glands are within normal limits bilaterally.  Thyroid: Normal Lymph nodes: Bilateral submandibular glands demonstrated a central fatty hilum. Sub cm bilateral level 2 lymph nodes also appear benign. No significant pathologic nodes are present. Vascular: Mild atherosclerotic calcifications are present at the carotid bifurcations bilaterally. There is mild prominence of soft tissue surrounding the internal carotid artery and both sides without luminal compromise. Limited intracranial: Within normal limits. Visualized orbits: Negative. Mastoids and visualized paranasal sinuses: The ethmoid air cells are partially opacified bilaterally. The paranasal sinuses are otherwise clear. Skeleton: Mild endplate degenerative changes are most pronounced at C5-6 with mild uncovertebral spurring. Extensive dental disease is present with numerous dental caries and periapical disease. Upper chest: Bullous changes are present at the right lung apex. The lung apices are otherwise clear. The upper mediastinum is unremarkable. IMPRESSION: 1. No discrete oral pharyngeal mass lesion. There is some prominence of the palatine tonsils bilaterally in which may related to acute or chronic disease. 2. Extensive dental caries and periapical disease. No discrete soft tissue abscess is associated. 3. Reactive type level 1 and level 2 lymph nodes. 4. Mild soft tissue prominence surrounding the carotid arteries bilaterally. This is nonspecific but can be seen in the setting of carotidynia. Electronically Signed   By: San Morelle M.D.   On: 06/07/2015 18:48   I have personally reviewed and evaluated these images and lab results as part of my medical decision-making.   EKG Interpretation None      MDM   Final diagnoses:  Pain due to dental caries    Lilia Argue presents with dental pain and oral swelling intermittently for the last year.  Findings and plan of care discussed with Quintella Reichert, MD. Dr. Ralene Bathe personally spoke to and evaluated this patient.  No signs of  ludwigs edema with no infralingual edema, no interference with patient's swallowing or breathing, and no submental edema. Patient has no signs of peritonsillar abscess. Patient is nontoxic appearing, afebrile, not tachycardic on my exam, not tachypneic, maintains SPO2 of 98% on room air, and is in no apparent distress. Patient has no signs of sepsis or other serious or life-threatening condition. After consulting with Dr. Ralene Bathe, it was decided that patient would receive a dose of IV clindamycin, pain management, basic labs, dental consult, and if kidney function allows, a CT soft tissue neck with contrast. No significant abnormalities were found on the patient's labs. During the patient's time here in the ED the patient mentioned that he was due for his dose of clonidine and Xanax. These medications  were verified with the pharmacy record and ordered for him here. Patient also requested to talk with someone for "financial assistance." Case management consult was placed. 5:25 PM Multiple attempts were made to make contact with Dr. Geralynn Ochs, on-call dentist, without success. Messages were left on both the office phone and the emergency number provided by his office. It seems as though it would be safe for the patient to just follow up with Dr. Hoyt Koch outpatient on Monday. Patient's vitals normalized while here in the ED. 6:25 PM Dr. Geralynn Ochs called on one of the ED phones. Stated he was trying to call since the first page with no answer. States that patient would benefit more from the oral surgeon's care and Dr. Hoyt Koch is remaining oral surgeon in the area. Dr. Geralynn Ochs added that the extent of the patient's extractions would require general anesthesia and most dentists, including himself, are not able to perform the extractions in this way. Recommended that the patient be discharged with by mouth clindamycin with exact follow-up on Monday, February 13 in Dr. Lupita Leash office. Patient will be referred to Dr. Hoyt Koch with strict  instructions to follow-up in his office on Monday, February 13. In a basket message will also be sent to Dr. Hoyt Koch to confirm that this patient will be coming on Monday. This plan was shared with Dr. Ralene Bathe, who approved. Patient was given instructions for home care as well as return precautions. Patient voiced understanding of these instructions, accepts the plan, and is comfortable with discharge.  Filed Vitals:   06/07/15 1340 06/07/15 1828 06/07/15 1940  BP: 160/95 167/90 135/88  Pulse: 101 76 79  Temp: 98.4 F (36.9 C) 97.6 F (36.4 C)   TempSrc: Oral Oral   Resp: 20 20 18   Height: 5\' 11"  (1.803 m)    Weight: 94.076 kg    SpO2: 98% 100% 99%     Lorayne Bender, PA-C 06/07/15 2044  Quintella Reichert, MD 06/08/15 [id number]

## 2015-06-07 NOTE — ED Notes (Signed)
Pt reports hx of dental issues. Pt has seen a dentist who told him he needs 12 teeth pulled by an oral surgeon but has not been able to do this. Pt has ongoing pain to his gums.

## 2015-06-07 NOTE — Discharge Instructions (Signed)
You have been seen today for dental pain and swelling. Your imaging and lab tests showed no abnormalities. You should follow-up with Dr. Hoyt Koch in his office as soon as possible. You will be switched to a new antibiotic upon discharge. You should stop taking the previous antibiotics. This discharge paperwork should serve as a sufficient referral. Follow up with PCP as needed. Return to ED should symptoms worsen. Please take all of your antibiotics until finished!   You may develop abdominal discomfort or diarrhea from the antibiotic.  You may help offset this with probiotics which you can buy or get in yogurt. Do not eat or take the probiotics until 2 hours after your antibiotic.

## 2015-06-12 ENCOUNTER — Encounter (HOSPITAL_COMMUNITY): Payer: Self-pay | Admitting: *Deleted

## 2015-06-12 NOTE — Progress Notes (Signed)
Anesthesia Chart Review: Patient is a 55 year old male scheduled for multiple dental extractions with alveoloplasty on 06/13/15/ by Dr. Hoyt Koch. He had ED visit 06/07/15 for dental pain. He was started on clindamycin and referred to Dr. Hoyt Koch.  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 Records indicate that patient previously reported a history of bicuspid AV, but 2011 echo notes AV to be trileaflet without regurgitation or stenosis.   PCP is Dr. Monico Blitz. He was seen by cardiologist Dr. Carlyle Dolly during his 03/2014 hospitalization for chest pain with abnormal EKG (LVH with probable strain pattern), but negative troponins and non-ischemic stress test. He was a no show for his 07/04/14 hospital follow-up appointment.  Meds include Xanax, amitriptyline, amlodipine, ASA, Fioricet, clindamycin, clonidine, Flexeril, Norco, Lopressor, Opana, K-dur, Requip, Halcion, tramadol, Topamax, Zocor.  04/30/15 EKG: SR, borderline first degree AVB, probable LAE, abnormal R wave progression, early transitions. Repolarization abnormality, consider ischemia anterolateral leads. Baseline wanderer V1, III. Findings appear stable when compared to 03/31/14 tracing (had cardiac testing at that time; see below).  04/02/14 Nuclear stress test: IMPRESSION: 1. No reversible ischemia or infarction. 2. Normal left ventricular wall motion. 3. Left ventricular ejection fraction 58% 4. Low-risk stress test findings*. *2012 Appropriate Use Criteria for Coronary Revascularization Focused Update: J Am Coll Cardiol. 2012;59(9):857-881. Http://content.airportbarriers.com.aspx?articleid=1201161  Interestingly, according to Dr. Nelly Laurence 04/02/14 hospital note, "he has had repeat echo this admit however due to computer issues over the weekend have not been able to review.  From review on echo machine moderate to severe concentric LVH, LVEF hyperdynamic 70-75%, no WMAs, grade II diastolic dysfunction. There is a mild subvavlular gradient due to [facility] in the setting of concentric LVH and hyperdynamic LV function. Cannot distinguish number of AV cusps, however there is no stenosis and no regurgitation. No evidence of valvular vegetation."   01/30/10 Echo: Study Conclusions - Left ventricle: The cavity size was normal. There was moderate concentric hypertrophy. Systolic function was hyperdynamic. The estimated ejection fraction was in the range of 70% to 75%. Wall motion was normal; there were no regional wall motion abnormalities. - Aortic valve: Trileaflet; normal thickness leaflets. Mobility was not restricted. Doppler: Transvalvular velocity was within the normal range. There was no stenosis. No regurgitation. VTI ratio ofLVOT to aortic valve: 0.98. Valve area: 3.09cm^2(VTI). Indexed valve area: 1.31cm^2/m^2 (VTI). Peak velocity ratio of LVOT to aorticvalve: 1.03. Valve area: 3.23cm^2 (Vmax). Indexed valve area: 1.37cm^2/m^2 (Vmax). Mean gradient: 56mm Hg (S). Peak gradient: 29mm Hg (S).  01/30/10 Carotid U/S: IMPRESSION: Minimal plaque formation at carotid bulbs. No evidence of hemodynamically significant stenosis.  Labs from 06/07/15 noted. (He reported his last A1c was 5.4, requested from PCP. Stated fasting CBG typically ~ 115.)  He had a recent stress and echo. If no acute changes then I would anticipate that he could proceed as planned.  George Hugh [facility]/Anesthesiology Phone [phone] 06/12/2015 12:22 PM

## 2015-06-12 NOTE — Progress Notes (Signed)
Pt states he has an Aortic valve issue, states it causes him to have an irregular heartrate. States he always has to take antibiotics prior to surgery. Denies recent chest pain or sob. Pt on several narcotic pain meds for chronic back pain and now teeth pain. States he's type 2 diabetic, never been on medication and his A1C was 5.4 recently. Have requested copy of result from Dr. Trena Platt office in [place]. He states his fasting blood sugar is usually around 115.

## 2015-06-13 ENCOUNTER — Ambulatory Visit (HOSPITAL_COMMUNITY): Payer: Medicare Other | Admitting: Vascular Surgery

## 2015-06-13 ENCOUNTER — Encounter (HOSPITAL_COMMUNITY): Payer: Self-pay | Admitting: General Practice

## 2015-06-13 ENCOUNTER — Ambulatory Visit (HOSPITAL_COMMUNITY)
Admission: RE | Admit: 2015-06-13 | Discharge: 2015-06-13 | Disposition: A | Discharge status: Home / Self Care | Payer: Medicare Other | Source: Ambulatory Visit | Attending: Oral Surgery | Admitting: Oral Surgery

## 2015-06-13 ENCOUNTER — Encounter (HOSPITAL_COMMUNITY)
Admission: RE | Disposition: A | Discharge status: Home / Self Care | Payer: Self-pay | Source: Ambulatory Visit | Attending: Oral Surgery

## 2015-06-13 DIAGNOSIS — Z79899 Other long term (current) drug therapy: Secondary | ICD-10-CM | POA: Diagnosis not present

## 2015-06-13 DIAGNOSIS — R51 Headache: Secondary | ICD-10-CM | POA: Diagnosis not present

## 2015-06-13 DIAGNOSIS — G2581 Restless legs syndrome: Secondary | ICD-10-CM | POA: Diagnosis not present

## 2015-06-13 DIAGNOSIS — Z8673 Personal history of transient ischemic attack (TIA), and cerebral infarction without residual deficits: Secondary | ICD-10-CM | POA: Diagnosis not present

## 2015-06-13 DIAGNOSIS — M270 Developmental disorders of jaws: Secondary | ICD-10-CM | POA: Diagnosis not present

## 2015-06-13 DIAGNOSIS — M1990 Unspecified osteoarthritis, unspecified site: Secondary | ICD-10-CM | POA: Insufficient documentation

## 2015-06-13 DIAGNOSIS — F419 Anxiety disorder, unspecified: Secondary | ICD-10-CM | POA: Insufficient documentation

## 2015-06-13 DIAGNOSIS — I10 Essential (primary) hypertension: Secondary | ICD-10-CM | POA: Diagnosis not present

## 2015-06-13 DIAGNOSIS — J449 Chronic obstructive pulmonary disease, unspecified: Secondary | ICD-10-CM | POA: Insufficient documentation

## 2015-06-13 DIAGNOSIS — K029 Dental caries, unspecified: Secondary | ICD-10-CM | POA: Diagnosis not present

## 2015-06-13 DIAGNOSIS — Z7982 Long term (current) use of aspirin: Secondary | ICD-10-CM | POA: Diagnosis not present

## 2015-06-13 DIAGNOSIS — E785 Hyperlipidemia, unspecified: Secondary | ICD-10-CM | POA: Insufficient documentation

## 2015-06-13 DIAGNOSIS — E119 Type 2 diabetes mellitus without complications: Secondary | ICD-10-CM | POA: Insufficient documentation

## 2015-06-13 DIAGNOSIS — F17200 Nicotine dependence, unspecified, uncomplicated: Secondary | ICD-10-CM | POA: Insufficient documentation

## 2015-06-13 HISTORY — DX: Chronic obstructive pulmonary disease, unspecified: J44.9

## 2015-06-13 HISTORY — PX: MULTIPLE EXTRACTIONS WITH ALVEOLOPLASTY: SHX5342

## 2015-06-13 HISTORY — DX: Anxiety disorder, unspecified: F41.9

## 2015-06-13 HISTORY — DX: Headache: R51

## 2015-06-13 HISTORY — DX: Headache, unspecified: R51.9

## 2015-06-13 HISTORY — DX: Restless legs syndrome: G25.81

## 2015-06-13 HISTORY — DX: Insomnia, unspecified: G47.00

## 2015-06-13 LAB — GLUCOSE, CAPILLARY
GLUCOSE-CAPILLARY: 132 mg/dL — AB (ref 65–99)
Glucose-Capillary: 129 mg/dL — ABNORMAL HIGH (ref 65–99)

## 2015-06-13 SURGERY — MULTIPLE EXTRACTION WITH ALVEOLOPLASTY
Anesthesia: General | Site: Mouth

## 2015-06-13 MED ORDER — PHENYLEPHRINE HCL 10 MG/ML IJ SOLN
INTRAMUSCULAR | Status: DC | PRN
  Administered 2015-06-13 (×5): 80 ug via INTRAVENOUS

## 2015-06-13 MED ORDER — HYDROMORPHONE HCL 1 MG/ML IJ SOLN
0.2500 mg | INTRAMUSCULAR | Status: DC | PRN
  Administered 2015-06-13 (×2): 0.5 mg via INTRAVENOUS

## 2015-06-13 MED ORDER — MIDAZOLAM HCL 2 MG/2ML IJ SOLN
INTRAMUSCULAR | Status: AC
  Filled 2015-06-13: qty 2

## 2015-06-13 MED ORDER — HYDROMORPHONE HCL 1 MG/ML IJ SOLN
INTRAMUSCULAR | Status: AC
  Filled 2015-06-13: qty 1

## 2015-06-13 MED ORDER — MEPERIDINE HCL 25 MG/ML IJ SOLN
6.2500 mg | INTRAMUSCULAR | Status: DC | PRN
Start: 2015-06-13 — End: 2015-06-13

## 2015-06-13 MED ORDER — HYDROMORPHONE HCL 1 MG/ML IJ SOLN
0.2500 mg | INTRAMUSCULAR | Status: DC | PRN
  Administered 2015-06-13: 0.5 mg via INTRAVENOUS

## 2015-06-13 MED ORDER — KETOROLAC TROMETHAMINE 30 MG/ML IJ SOLN
30.0000 mg | Freq: Once | INTRAMUSCULAR | Status: AC
Start: 2015-06-13 — End: 2015-06-13
  Administered 2015-06-13: 30 mg via INTRAVENOUS

## 2015-06-13 MED ORDER — OXYCODONE-ACETAMINOPHEN 5-325 MG PO TABS
ORAL_TABLET | ORAL | Status: AC
  Filled 2015-06-13: qty 1

## 2015-06-13 MED ORDER — SUCCINYLCHOLINE CHLORIDE 20 MG/ML IJ SOLN
INTRAMUSCULAR | Status: DC | PRN
  Administered 2015-06-13: 100 mg via INTRAVENOUS

## 2015-06-13 MED ORDER — ONDANSETRON HCL 4 MG/2ML IJ SOLN
INTRAMUSCULAR | Status: AC
  Filled 2015-06-13: qty 2

## 2015-06-13 MED ORDER — OXYCODONE-ACETAMINOPHEN 10-325 MG PO TABS: 1.0000 | ORAL_TABLET | ORAL | Status: DC | PRN

## 2015-06-13 MED ORDER — 0.9 % SODIUM CHLORIDE (POUR BTL) OPTIME
TOPICAL | Status: DC | PRN
  Administered 2015-06-13: 1000 mL

## 2015-06-13 MED ORDER — MIDAZOLAM HCL 2 MG/2ML IJ SOLN
0.5000 mg | Freq: Once | INTRAMUSCULAR | Status: AC | PRN
  Administered 2015-06-13: 1 mg via INTRAVENOUS

## 2015-06-13 MED ORDER — CEFAZOLIN SODIUM-DEXTROSE 2-3 GM-% IV SOLR
2.0000 g | INTRAVENOUS | Status: AC
  Administered 2015-06-13: 2 g via INTRAVENOUS
  Filled 2015-06-13: qty 50

## 2015-06-13 MED ORDER — LIDOCAINE-EPINEPHRINE 2 %-1:100000 IJ SOLN
INTRAMUSCULAR | Status: DC | PRN
  Administered 2015-06-13: 20 mL via INTRADERMAL

## 2015-06-13 MED ORDER — PHENYLEPHRINE 40 MCG/ML (10ML) SYRINGE FOR IV PUSH (FOR BLOOD PRESSURE SUPPORT)
PREFILLED_SYRINGE | INTRAVENOUS | Status: AC
  Filled 2015-06-13: qty 10

## 2015-06-13 MED ORDER — KETOROLAC TROMETHAMINE 30 MG/ML IJ SOLN
INTRAMUSCULAR | Status: DC
Start: 2015-06-13 — End: 2015-06-13
  Filled 2015-06-13: qty 1

## 2015-06-13 MED ORDER — OXYMETAZOLINE HCL 0.05 % NA SOLN
NASAL | Status: DC | PRN
  Administered 2015-06-13: 1 via TOPICAL

## 2015-06-13 MED ORDER — LIDOCAINE-EPINEPHRINE 2 %-1:100000 IJ SOLN
INTRAMUSCULAR | Status: AC
  Filled 2015-06-13: qty 1

## 2015-06-13 MED ORDER — PROPOFOL 10 MG/ML IV BOLUS
INTRAVENOUS | Status: AC
  Filled 2015-06-13: qty 20

## 2015-06-13 MED ORDER — HYDROMORPHONE HCL 1 MG/ML IJ SOLN
0.2500 mg | INTRAMUSCULAR | Status: DC | PRN
  Administered 2015-06-13 (×5): 0.5 mg via INTRAVENOUS

## 2015-06-13 MED ORDER — LACTATED RINGERS IV SOLN
INTRAVENOUS | Status: DC
  Administered 2015-06-13: 09:00:00 via INTRAVENOUS

## 2015-06-13 MED ORDER — ONDANSETRON HCL 4 MG/2ML IJ SOLN
INTRAMUSCULAR | Status: DC | PRN
  Administered 2015-06-13: 4 mg via INTRAVENOUS

## 2015-06-13 MED ORDER — MIDAZOLAM HCL 5 MG/5ML IJ SOLN
INTRAMUSCULAR | Status: DC | PRN
  Administered 2015-06-13: 2 mg via INTRAVENOUS

## 2015-06-13 MED ORDER — PROMETHAZINE HCL 25 MG/ML IJ SOLN: 6.2500 mg | INTRAMUSCULAR | Status: DC | PRN

## 2015-06-13 MED ORDER — FENTANYL CITRATE (PF) 100 MCG/2ML IJ SOLN
INTRAMUSCULAR | Status: DC | PRN
  Administered 2015-06-13: 150 ug via INTRAVENOUS
  Administered 2015-06-13 (×2): 50 ug via INTRAVENOUS

## 2015-06-13 MED ORDER — OXYMETAZOLINE HCL 0.05 % NA SOLN
NASAL | Status: AC
  Filled 2015-06-13: qty 15

## 2015-06-13 MED ORDER — PROPOFOL 10 MG/ML IV BOLUS
INTRAVENOUS | Status: DC | PRN
  Administered 2015-06-13: 200 mg via INTRAVENOUS

## 2015-06-13 MED ORDER — OXYCODONE-ACETAMINOPHEN 5-325 MG PO TABS
2.0000 | ORAL_TABLET | Freq: Once | ORAL | Status: AC
  Administered 2015-06-13: 2 via ORAL

## 2015-06-13 MED ORDER — FENTANYL CITRATE (PF) 250 MCG/5ML IJ SOLN
INTRAMUSCULAR | Status: AC
  Filled 2015-06-13: qty 5

## 2015-06-13 MED ORDER — SODIUM CHLORIDE 0.9 % IR SOLN
Status: DC | PRN
  Administered 2015-06-13: 1000 mL

## 2015-06-13 MED ORDER — SUCCINYLCHOLINE CHLORIDE 20 MG/ML IJ SOLN
INTRAMUSCULAR | Status: AC
  Filled 2015-06-13: qty 1

## 2015-06-13 SURGICAL SUPPLY — 27 items
BUR CROSS CUT FISSURE 1.6 (BURR) ×2 IMPLANT
BUR EGG ELITE 4.0 (BURR) ×2 IMPLANT
CANISTER SUCTION 2500CC (MISCELLANEOUS) ×2 IMPLANT
COVER SURGICAL LIGHT HANDLE (MISCELLANEOUS) ×2 IMPLANT
CRADLE DONUT ADULT HEAD (MISCELLANEOUS) ×2 IMPLANT
DRAPE U-SHAPE 76X120 STRL (DRAPES) ×2 IMPLANT
FLUID NSS /IRRIG 1000 ML XXX (MISCELLANEOUS) ×2 IMPLANT
GAUZE PACKING FOLDED 2  STR (GAUZE/BANDAGES/DRESSINGS) ×1
GAUZE PACKING FOLDED 2 STR (GAUZE/BANDAGES/DRESSINGS) ×1 IMPLANT
GLOVE BIO SURGEON STRL SZ 6.5 (GLOVE) ×2 IMPLANT
GLOVE BIO SURGEON STRL SZ7.5 (GLOVE) ×2 IMPLANT
GLOVE BIOGEL PI IND STRL 7.0 (GLOVE) IMPLANT
GLOVE BIOGEL PI INDICATOR 7.0 (GLOVE)
GOWN STRL REUS W/ TWL LRG LVL3 (GOWN DISPOSABLE) ×1 IMPLANT
GOWN STRL REUS W/ TWL XL LVL3 (GOWN DISPOSABLE) ×1 IMPLANT
GOWN STRL REUS W/TWL LRG LVL3 (GOWN DISPOSABLE) ×1
GOWN STRL REUS W/TWL XL LVL3 (GOWN DISPOSABLE) ×1
KIT BASIN OR (CUSTOM PROCEDURE TRAY) ×2 IMPLANT
KIT ROOM TURNOVER OR (KITS) ×2 IMPLANT
NEEDLE 22X1 1/2 (OR ONLY) (NEEDLE) ×4 IMPLANT
NS IRRIG 1000ML POUR BTL (IV SOLUTION) ×2 IMPLANT
PAD ARMBOARD 7.5X6 YLW CONV (MISCELLANEOUS) ×2 IMPLANT
SUT CHROMIC 3 0 PS 2 (SUTURE) ×6 IMPLANT
SYR CONTROL 10ML LL (SYRINGE) ×2 IMPLANT
TRAY ENT MC OR (CUSTOM PROCEDURE TRAY) ×2 IMPLANT
TUBING IRRIGATION (MISCELLANEOUS) ×2 IMPLANT
YANKAUER SUCT BULB TIP NO VENT (SUCTIONS) ×2 IMPLANT

## 2015-06-13 NOTE — Anesthesia Procedure Notes (Signed)
Procedure Name: Intubation Date/Time: 06/13/2015 10:44 AM Performed by: Kyung Rudd Pre-anesthesia Checklist: Patient identified, Emergency Drugs available, Suction available, Patient being monitored and Timeout performed Patient Re-evaluated:Patient Re-evaluated prior to inductionOxygen Delivery Method: Circle system utilized Preoxygenation: Pre-oxygenation with 100% oxygen Intubation Type: IV induction Ventilation: Mask ventilation without difficulty Laryngoscope Size: Mac and 4 Grade View: Grade I Nasal Tubes: Right, Nasal Rae and Magill forceps- large, utilized Tube size: 7.5 mm Number of attempts: 1 Placement Confirmation: positive ETCO2,  ETT inserted through vocal cords under direct vision and breath sounds checked- equal and bilateral Tube secured with: Tape Dental Injury: Teeth and Oropharynx as per pre-operative assessment

## 2015-06-13 NOTE — Progress Notes (Signed)
This note also relates to the following rows which could not be included: Pulse Rate - Cannot attach notes to unvalidated device data BP - Cannot attach notes to unvalidated device data SpO2 - Cannot attach notes to unvalidated device dof monitors x for o2 sat

## 2015-06-13 NOTE — Op Note (Signed)
NAMESTEFFIN, Garrett Mitchell                ACCOUNT NO.:  [id number]  MEDICAL RECORD NO.:  [id number]  LOCATION:  [facility]                         FACILITY:  [facility]  PHYSICIAN:  Gae Bon, M.D.  DATE OF BIRTH:  Jul 17, 1960  DATE OF PROCEDURE:  06/13/2015 DATE OF DISCHARGE:                              OPERATIVE REPORT   PREOPERATIVE DIAGNOSES:  Nonrestorable teeth #2, 3, 4, 6, 7, 8, 9, 10, 11, 13, 14, 15, 17, 18, 19, 20, 21, 22, 23, 24, 25, 26, 27, 28, 29, 30, 31, 32.  Bilateral mandibular lingual tori.  POSTOPERATIVE DIAGNOSES:  Nonrestorable teeth #2, 3, 4, 6, 7, 8, 9, 10, 11, 13, 14, 15, 17, 18, 19, 20, 21, 22, 23, 24, 25, 26, 27, 28, 29, 30, 31, 32.  Bilateral mandibular lingual tori.  PROCEDURE:  Extraction of teeth #2, 3, 4, 6, 7, 8, 9, 10, 11, 13, 14, 15, 17, 18, 19, 20, 21, 22, 23, 24, 25, 26, 27, 29, 30, 31, 32.  Removal of  bilateral mandibular lingual tori.  Alveoplasty right and left maxilla.  SURGEON:  Gae Bon, M.D.  ANESTHESIA:  General, Dr. Glennon Mac attending, nasal intubation.  DESCRIPTION OF PROCEDURE:  The patient was taken to the operating room, placed on the table in supine position.  General anesthesia was administered intravenously and a nasal endotracheal tube was placed and secured.  The eyes were protected.  The patient was draped for the procedure.  Time-out was performed.  The posterior pharynx was suctioned.  A throat pack was placed.  A 2% lidocaine with 1:100,000 epinephrine was infiltrated in the inferior alveolar block on the right and left side and then buccal and palatal infiltration in the maxilla. Total of 20 mL was utilized.  The bite block was placed in the right side of the mouth and a sweetheart retractor was used to retract the tongue.  A #15 blade was used to make an incision beginning at tooth #17 carrying forward across the midline both locally and lingually until tooth #27 was encountered.  In the maxilla, a 15 blade was used to  make an incision beginning at #15 and carrying forward buccally and palatally in the gingival sulcus until tooth #6 was accessed.  Then, the periosteum was reflected from around these teeth with a periosteal elevator.  The teeth were elevated with a 301 elevator.  The lower teeth #17, 18, 19, 21, 23, 24, 25 and 26 were all removed using the rongeurs and the dental forceps.  Tooth #22 fractured at the junction of the crown and cementum and the root was left.  Bone was removed circumferentially around this root with a Stryker handpiece under irrigation with the fissure bur until the tooth root was removed.  In the maxilla, the upper teeth were removed. #15, 14, 13, 10, 9, 8, 7 with the upper #150 forceps.  However, tooth #11 fractured at the root and bone was removed circumferentially around the tooth root until it was removed with the rongeur.  Then, the periosteum was further reflected to expose the alveolar crest in the maxilla and mandible and to expose the lingual torus on the left mandible alveoplasty and torus removal  was performed using an egg-shaped bur and bone file in both the maxilla and mandible.  Then, the areas were irrigated and closed with 3-0 chromic. The sweetheart retractor and bite block were repositioned on the other side of the mouth, and a similar procedure was used to remove teeth #2, 3, 4, and 6 in the maxilla.  Tooth #6 fractured during the attempted removal and bone was removed around the root and the tooth was removed with the #150 forceps.  In the mandible, teeth #27, 28, 29, 30, 31, 32 were removed with the forceps.  Tooth #27 fractured and required removal of additional bone so that the root could be removed.  Then, alveoplasty was performed in the right maxilla and mandible using egg-shaped bur and bone file and torus removal was performed in the right mandible using the egg-shaped bur and bone file.  Then, the areas were irrigated and closed with 3-0  chromic.  The oral cavity was inspected and found to have good contour, hemostasis, and closure. The oral cavity was irrigated and suctioned.  The throat pack was removed.  The patient was awakened and taken to recovery room after awakening and being extubated.     Gae Bon, M.D.     SMJ/MEDQ  D:  06/13/2015  T:  06/13/2015  Job:  [id number]

## 2015-06-13 NOTE — H&P (Signed)
HISTORY AND PHYSICAL  Garrett Mitchell is a 55 y.o. male patient with CC:  No diagnosis found.  Past Medical History  Diagnosis Date  . History of stroke   . Arthritis   . Essential hypertension   . Hyperlipemia   . Aortic valve disease     Presumably bicuspid - limited information Pt states he has an irregular heartrate due to this  . Dental caries   . Chronic back pain   . Stroke [facility]) 2009 or 2010    vision is blurry since having stroke  . Insomnia   . COPD (chronic obstructive pulmonary disease) ([place])   . Diabetes mellitus without complication (HCC)     Type 2, not on medications  . Anxiety   . Headache     bright lights cause headaches  . Restless leg syndrome     Current Facility-Administered Medications  Medication Dose Route Frequency Provider Last Rate Last Dose  . ceFAZolin (ANCEF) IVPB 2 g/50 mL premix  2 g Intravenous To SS-Surg Diona Browner, DDS       No Known Allergies Active Problems:   * No active hospital problems. *  Vitals: Blood pressure 144/89, pulse 66, temperature 98.1 F (36.7 C), temperature source Oral, height 5\' 11"  ([id number] m), weight 93.895 kg (207 lb), SpO2 99 %. Lab results: Results for orders placed or performed during the hospital encounter of 06/13/15 (from the past 24 hour(s))  Glucose, capillary     Status: Abnormal   Collection Time: 06/13/15  8:06 AM  Result Value Ref Range   Glucose-Capillary 132 (H) 65 - 99 mg/dL   Radiology Results: No results found. General appearance: alert and cooperative Head: Normocephalic, without obvious abnormality, atraumatic Eyes: negative Nose: Nares normal. Septum midline. Mucosa normal. No drainage or sinus tenderness. Throat: severe dental caries. bilateral mandibular tori. pharynx clear Neck: no adenopathy, supple, symmetrical, trachea midline and thyroid not enlarged, symmetric, no tenderness/mass/nodules Resp: clear to auscultation bilaterally Cardio: regular rate and rhythm, S1, S2 normal, no  murmur, click, rub or gallop  Assessment: Nonrestorable teeth secondary to dental caries, Bilateral mandibular lingual tori  Plan:Full mouth extractions with alveoloplasty. Removal bilateral mandibular lingual tori. General anesthesia. Day surgery.   Gae Bon 06/13/2015

## 2015-06-13 NOTE — Progress Notes (Signed)
Spoke with Drs Jenita Seashore re: pt states pain is 9-10/10  When mentioned he's being d/c'd. HR stable, bp stabilizing and outwardly looks calm, restful, wife at bedside, drinking soda freely. Will offer no more narcs per Dr Glennon Mac, orders for toradol per Dr Ricky Stabs.Relayed same to pt, he is ok with same and is being d/c'd.

## 2015-06-13 NOTE — Op Note (Signed)
06/13/2015  11:49 AM  PATIENT:  Garrett Mitchell  55 y.o. male  PRE-OPERATIVE DIAGNOSIS:  NON RESTORABLE TEETH #'S 2, 3, 4, 6, 7, 8, 9, 10, 11, 13, 14, 15, 17, 18, 19, 21, 20, 22, 23, 24, 25, 26, 27, 28, 29, 30, 31, 32, BILATERAL MANIBULAR LINGUAL TORI  POST-OPERATIVE DIAGNOSIS:  SAME  PROCEDURE:  Procedure(s): MULTIPLE EXTRACTIONS  TEETH #'S 2, 3, 4, 6, 7, 8, 9, 10, 11, 13, 14, 15, 17, 18, 19, 20, 21, 22, 23, 24, 25, 26, 27, 28, 29, 30, 31, 32, REMOVAL BILATERAL MANIBULAR LINGUAL TORI; ALVEOLOPLASTY RIGHT AND LEFT MAXILLA AND MANDIBLE SURGEON:  Surgeon(s): Diona Browner, DDS  ANESTHESIA:   local and general  EBL:  minimal  DRAINS: none   SPECIMEN:  No Specimen  COUNTS:  YES  PLAN OF CARE: Discharge to home after PACU  PATIENT DISPOSITION:  PACU - hemodynamically stable.   PROCEDURE DETAILS: Dictation # [id number] Gae Bon, DMD 06/13/2015 11:49 AM

## 2015-06-13 NOTE — Anesthesia Postprocedure Evaluation (Signed)
Anesthesia Post Note  Patient: Garrett Mitchell  Procedure(s) Performed: Procedure(s) (LRB): MULTIPLE EXTRACTIONS WITH ALVEOLOPLASTY (N/A)  Patient location during evaluation: PACU Anesthesia Type: General Level of consciousness: awake and alert, oriented and patient cooperative Pain management: pain level controlled Vital Signs Assessment: post-procedure vital signs reviewed and stable Respiratory status: spontaneous breathing, nonlabored ventilation, respiratory function stable and patient connected to nasal cannula oxygen Cardiovascular status: blood pressure returned to baseline and stable Postop Assessment: no signs of nausea or vomiting Anesthetic complications: no Comments: Pt remains uncomfortable, and says he will self-medicate at home.  D/c to home with Ice packs and cautioned against additional narcotic medication    Last Vitals:  Filed Vitals:   06/13/15 1400 06/13/15 1504  BP: 175/99 148/89  Pulse:  65  Temp:    Resp:      Last Pain:  Filed Vitals:   06/13/15 1504  PainSc: 8                  Brilynn Biasi,E. Grayer Sproles

## 2015-06-13 NOTE — Progress Notes (Signed)
oob in chair x 30 mins....has just taken Opana er 30/ tramadol 50/ clonidine 0.2 / lopressor 12.5/ xanax .5.Marland Kitchen..Marland Kitchenmeds of his own, due at this time or missed d/t surgery..... looks comfortable in chair

## 2015-06-13 NOTE — Anesthesia Preprocedure Evaluation (Addendum)
Anesthesia Evaluation  Patient identified by MRN, date of birth, ID band Patient awake    Reviewed: Allergy & Precautions, NPO status , Patient's Chart, lab work & pertinent test results, reviewed documented beta blocker date and time   History of Anesthesia Complications Negative for: history of anesthetic complications  Airway Mallampati: I  TM Distance: >3 FB Neck ROM: Full    Dental  (+) Loose, Poor Dentition, Chipped, Missing, Dental Advisory Given   Pulmonary COPD, Current Smoker,    breath sounds clear to auscultation       Cardiovascular hypertension, Pt. on medications and Pt. on home beta blockers (-) angina+ Valvular Problems/Murmurs (?bicuspid aortic valve?)  Rhythm:Regular Rate:Normal  '15 Stress: No reversible ischemia or infarction. Normal left ventricular wall motion. EF 58% '15 ECHO: EF 70-75% concentric LVH and hyperdynamic LV function. Cannot distinguish number of AV cusps, however there is no stenosis and no regurgitation   Neuro/Psych Anxiety CVA (eye symptoms), Residual Symptoms    GI/Hepatic Neg liver ROS, GERD  Controlled and Medicated,  Endo/Other  diabetes (diet controlled)  Renal/GU negative Renal ROS     Musculoskeletal   Abdominal   Peds  Hematology negative hematology ROS (+)   Anesthesia Other Findings   Reproductive/Obstetrics                        Anesthesia Physical Anesthesia Plan  ASA: III  Anesthesia Plan: General   Post-op Pain Management:    Induction: Intravenous  Airway Management Planned: Nasal ETT  Additional Equipment:   Intra-op Plan:   Post-operative Plan: Extubation in OR  Informed Consent: I have reviewed the patients History and Physical, chart, labs and discussed the procedure including the risks, benefits and alternatives for the proposed anesthesia with the patient or authorized representative who has indicated his/her understanding  and acceptance.     Plan Discussed with: CRNA and Surgeon  Anesthesia Plan Comments: (Plan routine monitors, GETA)        Anesthesia Quick Evaluation

## 2015-06-13 NOTE — Transfer of Care (Signed)
Immediate Anesthesia Transfer of Care Note  Patient: Garrett Mitchell  Procedure(s) Performed: Procedure(s): MULTIPLE EXTRACTIONS WITH ALVEOLOPLASTY (N/A)  Patient Location: PACU  Anesthesia Type:General  Level of Consciousness: awake, alert  and oriented  Airway & Oxygen Therapy: Patient Spontanous Breathing and Patient connected to face mask oxygen  Post-op Assessment: Report given to RN, Post -op Vital signs reviewed and stable and Patient moving all extremities X 4  Post vital signs: Reviewed and stable  Last Vitals:  Filed Vitals:   06/13/15 0800  BP: 144/89  Pulse: 66  Temp: 123XX123 C    Complications: No apparent anesthesia complications

## 2015-06-16 ENCOUNTER — Encounter (HOSPITAL_COMMUNITY): Payer: Self-pay | Admitting: Oral Surgery

## 2017-03-14 ENCOUNTER — Encounter: Payer: Self-pay | Admitting: Gastroenterology

## 2017-05-04 ENCOUNTER — Encounter: Payer: Self-pay | Admitting: Gastroenterology

## 2017-05-04 ENCOUNTER — Encounter: Payer: Self-pay | Admitting: *Deleted

## 2017-05-04 ENCOUNTER — Ambulatory Visit: Payer: Medicare Other | Admitting: Gastroenterology

## 2017-05-04 VITALS — BP 137/93 | HR 79 | Temp 97.0°F | Ht 71.0 in | Wt 207.6 lb

## 2017-05-04 DIAGNOSIS — B182 Chronic viral hepatitis C: Secondary | ICD-10-CM | POA: Diagnosis not present

## 2017-05-04 DIAGNOSIS — D126 Benign neoplasm of colon, unspecified: Secondary | ICD-10-CM | POA: Insufficient documentation

## 2017-05-04 DIAGNOSIS — Z1211 Encounter for screening for malignant neoplasm of colon: Secondary | ICD-10-CM | POA: Diagnosis not present

## 2017-05-04 NOTE — Assessment & Plan Note (Signed)
NO EVIDENCE FOR CIRRHOSIS-NL PLT CT,ALBUMIN, AND T BILI. LAST ALT > AST NOV 2018.  SEE HOME CARE INSTRUCTION BELOW TO PREVENT HEPATITIS C. AVOID ALCOHOL UNTIL WORKUP FOR HEPATITIS C IS COMPLETE. COMPLETE LABS AND ULTRASOUND NEXT WEEK. FOLLOW UP IN 2 MOS.

## 2017-05-04 NOTE — Progress Notes (Signed)
Subjective:    Patient ID: Garrett Mitchell, male    DOB: 1961/01/03, 57 y.o.   MRN: [id number]  Monico Blitz, MD  HPI Had colonoscopy ~6 yrs ago. HEP C Ab POS SINCE 2011. SHARED STRAWS AND RECEIVED BLOOD TRANSFUSIONS AS A CHILD. GETTING CONSTIPATED DUE TO PAIN MEDS AND TAKES STOOL SOFTENERS AND BATHROOM. IF PUSHES HARD CAN SEE BLOOD WHEN HE WIPES. HASN'T BEEN THAT LONG HAPPENING. FEELS A LUMP/KNOT AT FROM EDGE. WHEN HE WIPES IF LEAVES STREAK OF BLOOD. NO RECTAL PRESSURE, PAIN, ITCHING, BURNING, OR SOILING. USED DRINK EVER DAY FOR 10 YEARS. HAD DRINK CHRISTMAS AND NEW YEARS.  NOT HIGH ONE ETOH IN 10 YRS.  PT DENIES FEVER, CHILLS, HEMATEMESIS, nausea, vomiting, melena, diarrhea, CHEST PAIN, SHORTNESS OF BREATH,  CHANGE IN BOWEL IN HABITS, abdominal pain, problems swallowing, problems with sedation, OR heartburn or indigestion.  Past Medical History:  Diagnosis Date  . Anxiety   . Aortic valve disease    Presumably bicuspid - limited information Pt states he has an irregular heartrate due to this  . Arthritis   . Chronic back pain   . COPD (chronic obstructive pulmonary disease) ([place])   . Dental caries   . Diabetes mellitus without complication (HCC)    Type 2, not on medications  . Essential hypertension   . Headache    bright lights cause headaches  . History of stroke   . Hyperlipemia   . Insomnia   . Restless leg syndrome   . Stroke [facility]) 2009 or 2010   vision is blurry since having stroke   Past Surgical History:  Procedure Laterality Date  . BACK SURGERY     lumbar surgery  . BOWEL RESECTION     as a child  . CHOLECYSTECTOMY    . MULTIPLE EXTRACTIONS WITH ALVEOLOPLASTY N/A 06/13/2015   Procedure: MULTIPLE EXTRACTIONS WITH ALVEOLOPLASTY;  Surgeon: Diona Browner, DDS;  Location: [facility];  Service: Oral Surgery;  Laterality: N/A;  . VASECTOMY     No Known Allergies  Current Outpatient Medications  Medication Sig Dispense Refill  . ALPRAZolam (XANAX) 1 MG tablet Take 1 mg  by mouth 3 (three) times daily.    Marland Kitchen amitriptyline (ELAVIL) 25 MG tablet Take 25 mg by mouth at bedtime.     Marland Kitchen amLODipine (NORVASC) 10 MG tablet Take 10 mg by mouth every morning.     Marland Kitchen aspirin EC 81 MG EC tablet Take 1 tablet (81 mg total) by mouth daily.    . butalbital-aspirin-caffeine (FIORINAL) 50-325-40 MG capsule Take 1 capsule by mouth as needed.    . Cholecalciferol (VITAMIN D) 2000 units CAPS Take by mouth daily.    . cloNIDine (CATAPRES) 0.2 MG tablet Take 0.2 mg by mouth 2 (two) times daily.    Marland Kitchen HYDROcodone-acetaminophen (NORCO) 10-325 MG per tablet Take 1 tablet by mouth daily. For breakthrough pain States he takes one at 12 noon every day    . lidocaine (XYLOCAINE) 2 % solution Use as directed 20 mLs in the mouth or throat as needed for mouth pain.    . metoprolol tartrate (LOPRESSOR) 12.5 mg TABS tablet Take 0.5 tablets (12.5 mg total) by mouth 2 (two) times daily. (Patient taking differently: Take 12.5 mg by mouth 2 (two) times daily. Takes 1/2 tab daily)    . morphine (MSIR) 30 MG tablet Take 30 mg by mouth 3 (three) times daily.    Marland Kitchen NARCAN 4 MG/0.1ML LIQD nasal spray kit as needed.    Marland Kitchen  potassium chloride (K-DUR) 10 MEQ tablet Take 10 mEq by mouth daily.    Marland Kitchen PROAIR HFA 108 (90 Base) MCG/ACT inhaler as needed.    Marland Kitchen rOPINIRole (REQUIP) 0.25 MG tablet Take 0.5 mg by mouth at bedtime. Takes 2 at bedtime    . tiZANidine (ZANAFLEX) 2 MG tablet Take 2 mg by mouth every 8 (eight) hours.    . topiramate (TOPAMAX) 50 MG tablet Take 50 mg by mouth 2 (two) times daily.    . traMADol (ULTRAM) 50 MG tablet Take 50-100 mg by mouth every 6 (six) hours as needed for moderate pain (for breakthrough pain).     . triazolam (HALCION) 0.25 MG tablet Take 0.5 mg by mouth at bedtime.     . Multiple Vitamin (MULTIVITAMIN WITH MINERALS) TABS tablet Take 1 tablet by mouth daily.    .      .      .       Family History  Problem Relation Age of Onset  . CAD Father        Premature disease  .  Asthma Mother   . Colon cancer Neg Hx   . Colon polyps Neg Hx    Social History   Tobacco Use  . Smoking status: Current Every Day Smoker    Packs/day: 1.00    Types: Cigarettes  . Smokeless tobacco: Never Used  Substance Use Topics  . Alcohol use: No    Comment: Quit drinking 10 years ago as of 05/2015. Occas drinker now 05/04/17  . Drug use: No    Comment: quit 15 years ago 05/04/17   Review of Systems PER HPI OTHERWISE ALL SYSTEMS ARE NEGATIVE.    Objective:   Physical Exam  Constitutional: He is oriented to person, place, and time. He appears well-developed and well-nourished. No distress.  HENT:  Head: Normocephalic and atraumatic.  Mouth/Throat: Oropharynx is clear and moist. No oropharyngeal exudate.  Eyes: Pupils are equal, round, and reactive to light. No scleral icterus.  Neck: Normal range of motion. Neck supple.  Cardiovascular: Normal rate.  Murmur heard. Irregular RHYTHM, NL RATE  Pulmonary/Chest: Effort normal and breath sounds normal. No respiratory distress.  Abdominal: Soft. Bowel sounds are normal. He exhibits no distension. There is no tenderness.    INCISIONS WELL HEALED  Musculoskeletal: He exhibits no edema.  Lymphadenopathy:    He has no cervical adenopathy.  Neurological: He is alert and oriented to person, place, and time.  NO  NEW FOCAL DEFICITS  Psychiatric:  FLAT AFFECT, SLIGHTLY ANXIOUS MOOD  Vitals reviewed.     Assessment & Plan:

## 2017-05-04 NOTE — Assessment & Plan Note (Signed)
LAST TCS AGE 57. NO WARNING SIGNS/SYMPTOMS.  I WILL OBTAIN COLONOSCOPY REPORT FROM [facility].

## 2017-05-04 NOTE — Patient Instructions (Signed)
SEE HOME CARE INSTRUCTION BELOW TO PREVENT HEPATITIS C.  AVOID ALCOHOL UNTIL WORKUP FOR HEPATITIS C IS COMPLETE.  I WILL OBTAIN COLONOSCOPY REPORT FROM [facility].  COMPLETE LABS AND ULTRASOUND NEXT WEEK.  FOLLOW UP IN 2 MOS.     HOME CARE INSTRUCTIONS TO AVOID INFECTING FAMILY & FRIENDS WITH HEPATITIS C Rest when you feel tired, and eat when you are hungry.  Avoid a sexual relationship WITHOUT A CONDOM until advised otherwise by your caregiver.  Avoid activities that could expose other people to your blood. Examples include sharing a toothbrush, nail clippers, razors, and needles.  DO NOT SHARE CUPS. TOOTHBRUSH, RAZORS, OR BOTTLES WITH OTHERS. AVOID FRENCH KISSING ANYONE UNTIL YOU KNOW THE VIRUS HAS CLEARED. Do not take any medicines until your caregiver says it is okay. This includes over-the-counter drugs such as acetominophen that are usually taken for fever or pain.

## 2017-05-05 NOTE — Progress Notes (Signed)
CC'D TO PCP °

## 2017-05-05 NOTE — Progress Notes (Signed)
PATIENT SCHEDULED  °

## 2017-05-11 ENCOUNTER — Encounter: Payer: Self-pay | Admitting: Gastroenterology

## 2017-05-11 ENCOUNTER — Other Ambulatory Visit (HOSPITAL_COMMUNITY)
Admission: RE | Admit: 2017-05-11 | Discharge: 2017-05-11 | Disposition: A | Discharge status: Home / Self Care | Payer: Medicare Other | Source: Ambulatory Visit | Attending: Gastroenterology | Admitting: Gastroenterology

## 2017-05-11 ENCOUNTER — Ambulatory Visit (HOSPITAL_COMMUNITY)
Admission: RE | Admit: 2017-05-11 | Discharge: 2017-05-11 | Disposition: A | Discharge status: Home / Self Care | Payer: Medicare Other | Source: Ambulatory Visit | Attending: Gastroenterology | Admitting: Gastroenterology

## 2017-05-11 ENCOUNTER — Telehealth: Payer: Self-pay | Admitting: Gastroenterology

## 2017-05-11 DIAGNOSIS — Z7982 Long term (current) use of aspirin: Secondary | ICD-10-CM | POA: Insufficient documentation

## 2017-05-11 DIAGNOSIS — I10 Essential (primary) hypertension: Secondary | ICD-10-CM | POA: Insufficient documentation

## 2017-05-11 DIAGNOSIS — R932 Abnormal findings on diagnostic imaging of liver and biliary tract: Secondary | ICD-10-CM | POA: Insufficient documentation

## 2017-05-11 DIAGNOSIS — Z79899 Other long term (current) drug therapy: Secondary | ICD-10-CM | POA: Diagnosis not present

## 2017-05-11 DIAGNOSIS — Z8673 Personal history of transient ischemic attack (TIA), and cerebral infarction without residual deficits: Secondary | ICD-10-CM | POA: Insufficient documentation

## 2017-05-11 DIAGNOSIS — J449 Chronic obstructive pulmonary disease, unspecified: Secondary | ICD-10-CM | POA: Insufficient documentation

## 2017-05-11 DIAGNOSIS — F17210 Nicotine dependence, cigarettes, uncomplicated: Secondary | ICD-10-CM | POA: Diagnosis not present

## 2017-05-11 DIAGNOSIS — B182 Chronic viral hepatitis C: Secondary | ICD-10-CM | POA: Diagnosis not present

## 2017-05-11 DIAGNOSIS — E119 Type 2 diabetes mellitus without complications: Secondary | ICD-10-CM | POA: Diagnosis not present

## 2017-05-11 NOTE — Telephone Encounter (Signed)
PLEASE CALL PT. HE DOES NOT HAVE CIRRHOSIS. COMPLETE LABS AND AVOID ALCOHOL SO CAN BE READY FOR TREATMENT.

## 2017-05-11 NOTE — Telephone Encounter (Signed)
Pt aware.

## 2017-05-11 NOTE — Telephone Encounter (Signed)
Called, many rings and no answer.

## 2017-05-12 ENCOUNTER — Telehealth: Payer: Self-pay | Admitting: Gastroenterology

## 2017-05-12 NOTE — Telephone Encounter (Signed)
See lab result. Pt is aware.

## 2017-05-12 NOTE — Telephone Encounter (Signed)
Pt was calling to see if his lab results were back yet. [phone]

## 2017-05-12 NOTE — Progress Notes (Signed)
Pt is aware.  

## 2017-05-13 NOTE — Progress Notes (Signed)
PT's wife Jeannene Patella is aware.

## 2017-05-15 LAB — COMPLETE METABOLIC PANEL WITH GFR
AG Ratio: 2 (calc) (ref 1.0–2.5)
ALT: 26 U/L (ref 9–46)
AST: 19 U/L (ref 10–35)
Albumin: 4.5 g/dL (ref 3.6–5.1)
Alkaline phosphatase (APISO): 89 U/L (ref 40–115)
BUN: 9 mg/dL (ref 7–25)
CALCIUM: 9.3 mg/dL (ref 8.6–10.3)
CO2: 23 mmol/L (ref 20–32)
CREATININE: 0.97 mg/dL (ref 0.70–1.33)
Chloride: 108 mmol/L (ref 98–110)
GFR, EST NON AFRICAN AMERICAN: 87 mL/min/{1.73_m2} (ref >=60)
GFR, Est African American: 101 mL/min/{1.73_m2} (ref >=60)
GLOBULIN: 2.3 g/dL (ref 1.9–3.7)
Glucose, Bld: 80 mg/dL (ref 65–139)
Potassium: 4.1 mmol/L (ref 3.5–5.3)
Sodium: 140 mmol/L (ref 135–146)
Total Bilirubin: 0.3 mg/dL (ref 0.2–1.2)
Total Protein: 6.8 g/dL (ref 6.1–8.1)

## 2017-05-15 LAB — HCV RNA,LIPA RFLX NS5A DRUG RESIST: HCV GENOTYPE: NOT DETECTED

## 2017-05-15 LAB — HEPATITIS C RNA QUANTITATIVE
HCV QUANT LOG: NOT DETECTED {Log_IU}/mL
HCV RNA, PCR, QN: <15 IU/mL

## 2017-05-16 ENCOUNTER — Encounter: Payer: Self-pay | Admitting: Gastroenterology

## 2017-06-24 ENCOUNTER — Telehealth: Payer: Self-pay | Admitting: Gastroenterology

## 2017-06-24 NOTE — Telephone Encounter (Signed)
Pt has OV with SF on Wednesday to follow up with HEP C. His wife is saying that SF told her that he had no signs of HEP C and does he need to keep his OV with SF. I told her per SF that the patient does not have Cirrhosis and he should follow up with SF on Wednesday. His wife said that wasn't what she was told. I told her that I would send a note to SF/nurse to find out what SF recommends and if he should keep his OV or not and I would call her back on Monday. [phone] or [phone]

## 2017-06-24 NOTE — Telephone Encounter (Addendum)
PLEASE CALL PT. HE DOES NOT HAVE HEP C. HE HAS A MILD CASE OF FATTY LIVER BUT HIS LIVER ENZYMES ARE NORMAL. HE SHOULD KEEP HIS APPT BECAUSE HE NEEDS A REPEAT COLONOSCOPY DUE TO AN ADVANCED POLYP BEING REMOVED IN 2013 AND HE NEEDS AN EGD IF HE HASN'T HAD ONE.

## 2017-06-27 NOTE — Telephone Encounter (Signed)
PT called and said he could not make appt this week and he does not want to come back in if his liver is OK and he does not have Hep C. I explained to him about the colonoscopy and the advanced polyp and the need for EGD. He said he would reschedule OV appt due to having to get transportation.  Manuela Schwartz took call to reschedule appt.

## 2017-06-27 NOTE — Telephone Encounter (Signed)
OV made and pt aware.  

## 2017-06-29 ENCOUNTER — Ambulatory Visit: Payer: Medicare Other | Admitting: Gastroenterology

## 2017-08-25 ENCOUNTER — Ambulatory Visit: Payer: Medicare Other | Admitting: Gastroenterology

## 2017-12-01 ENCOUNTER — Telehealth: Payer: Self-pay

## 2017-12-01 NOTE — Telephone Encounter (Signed)
Pt left Vm he wanted to confirm appt on 12/07/2017 at 3:30 pm with Dr. Oneida Alar. I called and left VM that is correct and to call if questions.

## 2017-12-07 ENCOUNTER — Ambulatory Visit: Payer: Medicare Other | Admitting: Gastroenterology

## 2017-12-07 ENCOUNTER — Encounter: Payer: Self-pay | Admitting: *Deleted

## 2017-12-07 ENCOUNTER — Encounter: Payer: Self-pay | Admitting: Gastroenterology

## 2017-12-07 ENCOUNTER — Other Ambulatory Visit: Payer: Self-pay | Admitting: *Deleted

## 2017-12-07 DIAGNOSIS — T402X5A Adverse effect of other opioids, initial encounter: Secondary | ICD-10-CM | POA: Diagnosis not present

## 2017-12-07 DIAGNOSIS — K5903 Drug induced constipation: Secondary | ICD-10-CM

## 2017-12-07 DIAGNOSIS — Z86010 Personal history of colonic polyps: Secondary | ICD-10-CM

## 2017-12-07 DIAGNOSIS — D126 Benign neoplasm of colon, unspecified: Secondary | ICD-10-CM | POA: Diagnosis not present

## 2017-12-07 DIAGNOSIS — B182 Chronic viral hepatitis C: Secondary | ICD-10-CM | POA: Diagnosis not present

## 2017-12-07 MED ORDER — NA SULFATE-K SULFATE-MG SULF 17.5-3.13-1.6 GM/177ML PO SOLN: 1.0000 | ORAL | 0 refills | Status: DC

## 2017-12-07 NOTE — Progress Notes (Signed)
Subjective:    Patient ID: Garrett Mitchell, male    DOB: 1961/03/12, 57 y.o.   MRN: [id number]  Monico Blitz, MD   HPI Occasional constipation on pain meds especially TRAMADOL. TAKE STOOL SOFTENER. Cutting back on cigs and using Vape pen. BMs: EVERY DAY. FELT A LITTLE SOB YESTERDAY AND BP UNCONTROLLED. VAPE PENS MAKE HIM COUGH.   PT DENIES FEVER, CHILLS, HEMATOCHEZIA, HEMATEMESIS, nausea, vomiting, melena, diarrhea, CHEST PAIN, SHORTNESS OF BREATH, CHANGE IN BOWEL IN HABITS, constipation, abdominal pain, problems swallowing, problems with sedation, OR heartburn or indigestion.  Past Medical History:  Diagnosis Date  . Anxiety   . Aortic valve disease    Presumably bicuspid - limited information Pt states he has an irregular heartrate due to this  . Arthritis   . Chronic back pain   . COPD (chronic obstructive pulmonary disease) ([place])   . Dental caries   . Diabetes mellitus without complication (HCC)    Type 2, not on medications  . Essential hypertension   . Headache    bright lights cause headaches  . Hepatitis C antibody positive in blood 2011   HEP C RNA NEG JAN 2019  . History of stroke   . Hyperlipemia   . Insomnia   . Restless leg syndrome   . Stroke [facility]) 2009 or 2010   vision is blurry since having stroke  . 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 BACK SURGERY     lumbar surgery  . BOWEL RESECTION     as a child  . CHOLECYSTECTOMY    . MULTIPLE EXTRACTIONS WITH ALVEOLOPLASTY N/A 06/13/2015     . VASECTOMY     No Known Allergies  Current Outpatient Medications  Medication Sig    . ALPRAZolam (XANAX) 0.5 MG tablet Take 0.5 mg by mouth 3 (three) times daily as needed.    Marland Kitchen amitriptyline (ELAVIL) 25 MG tablet Take 25 mg by mouth at bedtime.     Marland Kitchen amLODipine (NORVASC) 10 MG tablet Take 10 mg by mouth every morning.     Marland Kitchen aspirin EC 81 MG EC tablet Take 1 tablet (81 mg total)  by mouth daily.    . Butalbital-APAP-Caffeine 50-300-40 MG CAPS Take 1 capsule by mouth every 4 (four) hours as needed.    . Cholecalciferol (VITAMIN D) 2000 units CAPS Take by mouth daily.    . cloNIDine (CATAPRES) 0.2 MG tablet Take 0.2 mg by mouth 2 (two) times daily.    . metoprolol tartrate (LOPRESSOR) 12.5 mg TABS tablet Take 0.5 tablets (12.5 mg total) by mouth 2 (two) times daily.    Marland Kitchen morphine (MSIR) 30 MG tablet Take 30 mg by mouth 3 (three) times daily.    . Multiple Vitamin (MULTIVITAMIN WITH MINERALS) TABS tablet Take 1 tablet by mouth daily.    Marland Kitchen NARCAN 4 MG/0.1ML LIQD nasal spray kit as needed.    . potassium chloride (K-DUR) 10 MEQ tablet Take 10 mEq by mouth daily.    Marland Kitchen PROAIR HFA 108 (90 Base) MCG/ACT inhaler as needed.    Marland Kitchen rOPINIRole (REQUIP) 0.25 MG tablet Take 0.5 mg by mouth at bedtime. Takes 2 at bedtime    . tiZANidine (ZANAFLEX) 2 MG tablet Take 2 mg by mouth every 8 (eight) hours.    . topiramate (TOPAMAX) 50 MG tablet Take 50 mg by mouth 2 (two) times daily.    Marland Kitchen  traMADol (ULTRAM) 50 MG tablet Take 50-100 mg by mouth every 6 (six) hours as needed for moderate pain (for breakthrough pain).     . triazolam (HALCION) 0.25 MG tablet Take 0.5 mg by mouth at bedtime.     . ALPRAZolam (XANAX) 1 MG tablet Take 1 mg by mouth 3 (three) times daily.    . butalbital-aspirin-caffeine (FIORINAL) 50-325-40 MG capsule Take 1 capsule by mouth as needed.    Marland Kitchen HYDROcodone-acetaminophen (NORCO) 10-325 MG per tablet Take 1 tablet by mouth daily. For breakthrough pain States he takes one at 12 noon every day    .      .      Shon Baton ER, CRUSH RESISTANT, 30 MG T12A Take 30 mg by mouth every 12 (twelve) hours.    .       Review of Systems PER HPI OTHERWISE ALL SYSTEMS ARE NEGATIVE.    Objective:   Physical Exam  Constitutional: He is oriented to person, place, and time. He appears well-developed and well-nourished. No distress.  HENT:  Head: Normocephalic and atraumatic.    Mouth/Throat: Oropharynx is clear and moist. No oropharyngeal exudate.  Eyes: Pupils are equal, round, and reactive to light. No scleral icterus.  Neck: Normal range of motion. Neck supple.  Cardiovascular: Normal rate, regular rhythm and normal heart sounds.  Pulmonary/Chest: Effort normal and breath sounds normal. No respiratory distress.  Abdominal: Soft. Bowel sounds are normal. He exhibits no distension. There is no tenderness.  Musculoskeletal: He exhibits no edema.  Lymphadenopathy:    He has no cervical adenopathy.  Neurological: He is alert and oriented to person, place, and time.  NO  NEW FOCAL DEFICITS  Psychiatric:  NL AFFECT, slightly ANXIOUS MOOD  Vitals reviewed.     Assessment & Plan:

## 2017-12-07 NOTE — Assessment & Plan Note (Signed)
HEP C VL NEG JAN 2019.  CONTINUE TO MONITOR SYMPTOMS.

## 2017-12-07 NOTE — Assessment & Plan Note (Signed)
NO BRBPR OR MELENA.    COMPLETE COLONOSCOPY AND UPPER ENDOSCOPY TO LOOK AT YOUR ESOPHAGUS AND TO LOOK FOR POLYPS. YOU MAY BRING THE ENEMA TO ADMINISTER IN THE PREOP AREA. DISCUSSED PROCEDURE, BENEFITS, & RISKS: < 1% chance of medication reaction, bleeding, perforation, or rupture of spleen/liver.  FOLLOW UP IN 1 YEAR.

## 2017-12-07 NOTE — Patient Instructions (Addendum)
  COMPLETE COLONOSCOPY AND UPPER ENDOSCOPY TO LOOK AT YOUR ESOPHAGUS AND TO LOOK FOR POLYPS. YOU MAY BRING THE ENEMA TO ADMINISTER IN THE PREOP AREA.   FOLLOW UP IN 1 YEAR.

## 2017-12-07 NOTE — Assessment & Plan Note (Signed)
SYMPTOMS FAIRLY WELL CONTROLLED.  DRINK WATER EAT FIBER FOLLOW UP IN 1 YEAR.

## 2017-12-08 ENCOUNTER — Encounter: Payer: Self-pay | Admitting: *Deleted

## 2017-12-08 ENCOUNTER — Telehealth: Payer: Self-pay | Admitting: *Deleted

## 2017-12-08 ENCOUNTER — Other Ambulatory Visit: Payer: Self-pay | Admitting: *Deleted

## 2017-12-08 DIAGNOSIS — Z86010 Personal history of colonic polyps: Secondary | ICD-10-CM

## 2017-12-08 NOTE — Progress Notes (Signed)
cc'd to pcp 

## 2017-12-08 NOTE — Telephone Encounter (Signed)
-----   Message from Danie Binder, MD sent at 12/07/2017  5:35 PM EDT ----- EGDTCS W/ MAC-COLON POLYPS/SCREEN FOR [facility]

## 2017-12-08 NOTE — Progress Notes (Signed)
ON RECALL  °

## 2017-12-08 NOTE — Telephone Encounter (Signed)
LMOVM

## 2017-12-08 NOTE — Telephone Encounter (Signed)
Patient called back and made aware of appt change. He is scheduled for 02/07/18 at 2:00pm. I have mailed new instructions to him. Aware I will call back with pre-op appt.

## 2017-12-08 NOTE — Telephone Encounter (Signed)
Pre-op scheduled for 02/01/18 at 11:00am. Letter mailed per pt request with appt information

## 2017-12-14 ENCOUNTER — Telehealth: Payer: Self-pay | Admitting: Gastroenterology

## 2017-12-14 NOTE — Telephone Encounter (Signed)
[phone]  Please call patient, he has questions about hepatitis, sald he was told he did not have it but there was something on the papers he got that said it was discussed.  He seems confused

## 2017-12-15 NOTE — Telephone Encounter (Signed)
LM for a return call from pt.

## 2017-12-20 ENCOUNTER — Encounter: Payer: Self-pay | Admitting: Gastroenterology

## 2017-12-20 NOTE — Telephone Encounter (Signed)
PLEASE CALL PT. He had HEPATITIS C but cleared the virus. His Ab is positive but the viral load is negative. We will add hiS surgery to the list.

## 2017-12-20 NOTE — Telephone Encounter (Addendum)
PT said he wondered why his AVS said that Chronic Hep C was discussed when Dr. Oneida Alar told him he does not have Hep C.  He said he was never told before that he had Hep C until at Dr. Trena Platt office a few months before he came here.   Then Dr. Oneida Alar told him he didn't have it and he is confused.  ( Pt also said to let Dr. Nona Dell know that he had an intussusception when he was about 57 years old and had about 2 feet of his colon removed, and has permanent stitches).   Please advise!

## 2017-12-20 NOTE — Telephone Encounter (Signed)
LMOM for a return call.  

## 2017-12-21 ENCOUNTER — Encounter: Payer: Self-pay | Admitting: Gastroenterology

## 2017-12-21 NOTE — Telephone Encounter (Signed)
Pt is aware.  

## 2018-01-25 ENCOUNTER — Telehealth: Payer: Self-pay | Admitting: Gastroenterology

## 2018-01-25 NOTE — Telephone Encounter (Signed)
Please call patient at [phone] or [phone] regarding his pre op on 10/9. He has questions about his recent labs and if he still needs pre op.

## 2018-01-26 NOTE — Telephone Encounter (Signed)
Pt has no recent labs in chart. He will need to go to pre-op anyway to have EKG. Tried to call pt, no answer, LMOAM and informed him.

## 2018-01-30 ENCOUNTER — Encounter (HOSPITAL_COMMUNITY): Payer: Self-pay

## 2018-01-30 ENCOUNTER — Ambulatory Visit (HOSPITAL_COMMUNITY): Admit: 2018-01-30 | Payer: Medicare Other | Admitting: Gastroenterology

## 2018-01-30 SURGERY — COLONOSCOPY
Anesthesia: Moderate Sedation

## 2018-01-30 NOTE — Patient Instructions (Signed)
Your procedure is scheduled on: 02/07/2018  Report to [facility] at     12:45 PM.  Call this number if you have problems the morning of surgery: [phone]   Remember:              Follow Directions on the letter you received from Your Physician's office regarding the Bowel Prep  :  Take these medicines the morning of surgery with A SIP OF WATER:Amlodipine, Clonidine, Metoprolol / Flexeril, Morphine, tramadol and Pro Air inhaler if              needed   Do not wear jewelry, make-up or nail polish.    Do not bring valuables to the hospital.  Contacts, dentures or bridgework may not be worn into surgery.  .   Patients discharged the day of surgery will not be allowed to drive home.     Colonoscopy, Adult, Care After This sheet gives you information about how to care for yourself after your procedure. Your health care provider may also give you more specific instructions. If you have problems or questions, contact your health care provider. What can I expect after the procedure? After the procedure, it is common to have:  A small amount of blood in your stool for 24 hours after the procedure.  Some gas.  Mild abdominal cramping or bloating.  Follow these instructions at home: General instructions   For the first 24 hours after the procedure: ? Do not drive or use machinery. ? Do not sign important documents. ? Do not drink alcohol. ? Do your regular daily activities at a slower pace than normal. ? Eat soft, easy-to-digest foods. ? Rest often.  Take over-the-counter or prescription medicines only as told by your health care provider.  It is up to you to get the results of your procedure. Ask your health care provider, or the department performing the procedure, when your results will be ready. Relieving cramping and bloating  Try walking around when you have cramps or feel bloated.  Apply heat to your abdomen as told by your health care provider. Use a heat source  that your health care provider recommends, such as a moist heat pack or a heating pad. ? Place a towel between your skin and the heat source. ? Leave the heat on for 20-30 minutes. ? Remove the heat if your skin turns bright red. This is especially important if you are unable to feel pain, heat, or cold. You may have a greater risk of getting burned. Eating and drinking  Drink enough fluid to keep your urine clear or pale yellow.  Resume your normal diet as instructed by your health care provider. Avoid heavy or fried foods that are hard to digest.  Avoid drinking alcohol for as long as instructed by your health care provider. Contact a health care provider if:  You have blood in your stool 2-3 days after the procedure. Get help right away if:  You have more than a small spotting of blood in your stool.  You pass large blood clots in your stool.  Your abdomen is swollen.  You have nausea or vomiting.  You have a fever.  You have increasing abdominal pain that is not relieved with medicine. This information is not intended to replace advice given to you by your health care provider. Make sure you discuss any questions you have with your health care provider. Document Released: 11/25/2003 Document Revised: 01/05/2016 Document Reviewed: 06/24/2015 Elsevier Interactive Patient  Education  2018 [employer]. Esophagogastroduodenoscopy, Care After Refer to this sheet in the next few weeks. These instructions provide you with information about caring for yourself after your procedure. Your health care provider may also give you more specific instructions. Your treatment has been planned according to current medical practices, but problems sometimes occur. Call your health care provider if you have any problems or questions after your procedure. What can I expect after the procedure? After the procedure, it is common to have:  A sore  throat.  Nausea.  Bloating.  Dizziness.  Fatigue.  Follow these instructions at home:  Do not eat or drink anything until the numbing medicine (local anesthetic) has worn off and your gag reflex has returned. You will know that the local anesthetic has worn off when you can swallow comfortably.  Do not drive for 24 hours if you received a medicine to help you relax (sedative).  If your health care provider took a tissue sample for testing during the procedure, make sure to get your test results. This is your responsibility. Ask your health care provider or the department performing the test when your results will be ready.  Keep all follow-up visits as told by your health care provider. This is important. Contact a health care provider if:  You cannot stop coughing.  You are not urinating.  You are urinating less than usual. Get help right away if:  You have trouble swallowing.  You cannot eat or drink.  You have throat or chest pain that gets worse.  You are dizzy or light-headed.  You faint.  You have nausea or vomiting.  You have chills.  You have a fever.  You have severe abdominal pain.  You have black, tarry, or bloody stools. This information is not intended to replace advice given to you by your health care provider. Make sure you discuss any questions you have with your health care provider. Document Released: 03/29/2012 Document Revised: 09/18/2015 Document Reviewed: 03/06/2015 Elsevier Interactive Patient Education  [employer].

## 2018-02-01 ENCOUNTER — Other Ambulatory Visit: Payer: Self-pay

## 2018-02-01 ENCOUNTER — Encounter (HOSPITAL_COMMUNITY)
Admission: RE | Admit: 2018-02-01 | Discharge: 2018-02-01 | Disposition: A | Discharge status: Home / Self Care | Payer: Medicare Other | Source: Ambulatory Visit | Attending: Gastroenterology | Admitting: Gastroenterology

## 2018-02-01 ENCOUNTER — Encounter (HOSPITAL_COMMUNITY): Payer: Self-pay

## 2018-02-01 DIAGNOSIS — I493 Ventricular premature depolarization: Secondary | ICD-10-CM | POA: Diagnosis not present

## 2018-02-01 DIAGNOSIS — Z86010 Personal history of colonic polyps: Secondary | ICD-10-CM | POA: Insufficient documentation

## 2018-02-01 DIAGNOSIS — Z01818 Encounter for other preprocedural examination: Secondary | ICD-10-CM | POA: Diagnosis present

## 2018-02-01 LAB — BASIC METABOLIC PANEL
Anion gap: 8 (ref 5–15)
BUN: 11 mg/dL (ref 6–20)
CALCIUM: 9.1 mg/dL (ref 8.9–10.3)
CO2: 23 mmol/L (ref 22–32)
CREATININE: 0.85 mg/dL (ref 0.61–1.24)
Chloride: 107 mmol/L (ref 98–111)
GFR calc Af Amer: >60 mL/min (ref >60)
GFR calc non Af Amer: >60 mL/min (ref >60)
GLUCOSE: 105 mg/dL — AB (ref 70–99)
Potassium: 4.1 mmol/L (ref 3.5–5.1)
Sodium: 138 mmol/L (ref 135–145)

## 2018-02-01 LAB — CBC WITH DIFFERENTIAL/PLATELET
Abs Immature Granulocytes: 0.03 10*3/uL (ref 0.00–0.07)
Basophils Absolute: 0.1 10*3/uL (ref 0.0–0.1)
Basophils Relative: 1 %
EOS ABS: 0.7 10*3/uL — AB (ref 0.0–0.5)
EOS PCT: 8 %
HEMATOCRIT: 42.9 % (ref 39.0–52.0)
Hemoglobin: 13.7 g/dL (ref 13.0–17.0)
Immature Granulocytes: 0 %
LYMPHS ABS: 2.1 10*3/uL (ref 0.7–4.0)
Lymphocytes Relative: 25 %
MCH: 32.2 pg (ref 26.0–34.0)
MCHC: 31.9 g/dL (ref 30.0–36.0)
MCV: 100.7 fL — AB (ref 80.0–100.0)
MONOS PCT: 9 %
Monocytes Absolute: 0.8 10*3/uL (ref 0.1–1.0)
NRBC: 0 % (ref 0.0–0.2)
Neutro Abs: 4.9 10*3/uL (ref 1.7–7.7)
Neutrophils Relative %: 57 %
Platelets: 264 10*3/uL (ref 150–400)
RBC: 4.26 MIL/uL (ref 4.22–5.81)
RDW: 13.8 % (ref 11.5–15.5)
WBC: 8.6 10*3/uL (ref 4.0–10.5)

## 2018-02-07 ENCOUNTER — Encounter (HOSPITAL_COMMUNITY)
Admission: RE | Disposition: A | Discharge status: Home / Self Care | Payer: Self-pay | Source: Ambulatory Visit | Attending: Gastroenterology

## 2018-02-07 ENCOUNTER — Ambulatory Visit (HOSPITAL_COMMUNITY): Payer: Medicare Other | Admitting: Anesthesiology

## 2018-02-07 ENCOUNTER — Other Ambulatory Visit: Payer: Self-pay

## 2018-02-07 ENCOUNTER — Encounter (HOSPITAL_COMMUNITY): Payer: Self-pay | Admitting: *Deleted

## 2018-02-07 ENCOUNTER — Ambulatory Visit (HOSPITAL_COMMUNITY)
Admission: RE | Admit: 2018-02-07 | Discharge: 2018-02-07 | Disposition: A | Discharge status: Home / Self Care | Payer: Medicare Other | Source: Ambulatory Visit | Attending: Gastroenterology | Admitting: Gastroenterology

## 2018-02-07 DIAGNOSIS — R9431 Abnormal electrocardiogram [ECG] [EKG]: Secondary | ICD-10-CM

## 2018-02-07 DIAGNOSIS — K648 Other hemorrhoids: Secondary | ICD-10-CM | POA: Insufficient documentation

## 2018-02-07 DIAGNOSIS — F419 Anxiety disorder, unspecified: Secondary | ICD-10-CM | POA: Insufficient documentation

## 2018-02-07 DIAGNOSIS — Z1211 Encounter for screening for malignant neoplasm of colon: Secondary | ICD-10-CM | POA: Diagnosis not present

## 2018-02-07 DIAGNOSIS — D124 Benign neoplasm of descending colon: Secondary | ICD-10-CM | POA: Insufficient documentation

## 2018-02-07 DIAGNOSIS — F17210 Nicotine dependence, cigarettes, uncomplicated: Secondary | ICD-10-CM | POA: Insufficient documentation

## 2018-02-07 DIAGNOSIS — D125 Benign neoplasm of sigmoid colon: Secondary | ICD-10-CM | POA: Insufficient documentation

## 2018-02-07 DIAGNOSIS — D123 Benign neoplasm of transverse colon: Secondary | ICD-10-CM

## 2018-02-07 DIAGNOSIS — K2950 Unspecified chronic gastritis without bleeding: Secondary | ICD-10-CM | POA: Diagnosis not present

## 2018-02-07 DIAGNOSIS — K319 Disease of stomach and duodenum, unspecified: Secondary | ICD-10-CM | POA: Diagnosis not present

## 2018-02-07 DIAGNOSIS — Z13810 Encounter for screening for upper gastrointestinal disorder: Secondary | ICD-10-CM | POA: Diagnosis present

## 2018-02-07 DIAGNOSIS — Q438 Other specified congenital malformations of intestine: Secondary | ICD-10-CM | POA: Insufficient documentation

## 2018-02-07 DIAGNOSIS — G2581 Restless legs syndrome: Secondary | ICD-10-CM | POA: Diagnosis not present

## 2018-02-07 DIAGNOSIS — G4700 Insomnia, unspecified: Secondary | ICD-10-CM | POA: Insufficient documentation

## 2018-02-07 DIAGNOSIS — I10 Essential (primary) hypertension: Secondary | ICD-10-CM | POA: Insufficient documentation

## 2018-02-07 DIAGNOSIS — D128 Benign neoplasm of rectum: Secondary | ICD-10-CM | POA: Diagnosis not present

## 2018-02-07 DIAGNOSIS — Z9049 Acquired absence of other specified parts of digestive tract: Secondary | ICD-10-CM | POA: Insufficient documentation

## 2018-02-07 DIAGNOSIS — Z79899 Other long term (current) drug therapy: Secondary | ICD-10-CM | POA: Diagnosis not present

## 2018-02-07 DIAGNOSIS — Z8249 Family history of ischemic heart disease and other diseases of the circulatory system: Secondary | ICD-10-CM | POA: Insufficient documentation

## 2018-02-07 DIAGNOSIS — J449 Chronic obstructive pulmonary disease, unspecified: Secondary | ICD-10-CM | POA: Insufficient documentation

## 2018-02-07 DIAGNOSIS — Z86010 Personal history of colonic polyps: Secondary | ICD-10-CM | POA: Diagnosis present

## 2018-02-07 DIAGNOSIS — M1990 Unspecified osteoarthritis, unspecified site: Secondary | ICD-10-CM | POA: Diagnosis not present

## 2018-02-07 DIAGNOSIS — G8929 Other chronic pain: Secondary | ICD-10-CM | POA: Insufficient documentation

## 2018-02-07 DIAGNOSIS — K219 Gastro-esophageal reflux disease without esophagitis: Secondary | ICD-10-CM | POA: Diagnosis not present

## 2018-02-07 DIAGNOSIS — E119 Type 2 diabetes mellitus without complications: Secondary | ICD-10-CM | POA: Insufficient documentation

## 2018-02-07 DIAGNOSIS — K3189 Other diseases of stomach and duodenum: Secondary | ICD-10-CM

## 2018-02-07 DIAGNOSIS — D122 Benign neoplasm of ascending colon: Secondary | ICD-10-CM | POA: Diagnosis not present

## 2018-02-07 DIAGNOSIS — K2970 Gastritis, unspecified, without bleeding: Secondary | ICD-10-CM

## 2018-02-07 DIAGNOSIS — Z8673 Personal history of transient ischemic attack (TIA), and cerebral infarction without residual deficits: Secondary | ICD-10-CM | POA: Insufficient documentation

## 2018-02-07 DIAGNOSIS — K449 Diaphragmatic hernia without obstruction or gangrene: Secondary | ICD-10-CM | POA: Diagnosis not present

## 2018-02-07 DIAGNOSIS — Z7982 Long term (current) use of aspirin: Secondary | ICD-10-CM | POA: Diagnosis not present

## 2018-02-07 HISTORY — PX: ESOPHAGOGASTRODUODENOSCOPY (EGD) WITH PROPOFOL: SHX5813

## 2018-02-07 HISTORY — PX: COLONOSCOPY WITH PROPOFOL: SHX5780

## 2018-02-07 HISTORY — PX: BIOPSY: SHX5522

## 2018-02-07 HISTORY — PX: POLYPECTOMY: SHX5525

## 2018-02-07 LAB — GLUCOSE, CAPILLARY: Glucose-Capillary: 143 mg/dL — ABNORMAL HIGH (ref 70–99)

## 2018-02-07 SURGERY — COLONOSCOPY WITH PROPOFOL
Anesthesia: Monitor Anesthesia Care

## 2018-02-07 MED ORDER — PROPOFOL 10 MG/ML IV BOLUS
INTRAVENOUS | Status: AC
  Filled 2018-02-07: qty 20

## 2018-02-07 MED ORDER — PROPOFOL 500 MG/50ML IV EMUL
INTRAVENOUS | Status: DC | PRN
  Administered 2018-02-07 (×3): via INTRAVENOUS
  Administered 2018-02-07: 150 ug/kg/min via INTRAVENOUS

## 2018-02-07 MED ORDER — SPOT INK MARKER SYRINGE KIT
PACK | SUBMUCOSAL | Status: AC
  Filled 2018-02-07: qty 5

## 2018-02-07 MED ORDER — SODIUM CHLORIDE 0.9 % IJ SOLN
PREFILLED_SYRINGE | INTRAMUSCULAR | Status: DC | PRN
  Administered 2018-02-07: 6 mL

## 2018-02-07 MED ORDER — KETAMINE HCL 10 MG/ML IJ SOLN
INTRAMUSCULAR | Status: DC | PRN
  Administered 2018-02-07 (×2): 10 mg via INTRAVENOUS

## 2018-02-07 MED ORDER — STERILE WATER FOR IRRIGATION IR SOLN
Status: DC | PRN
  Administered 2018-02-07: 100 mL

## 2018-02-07 MED ORDER — SPOT INK MARKER SYRINGE KIT
PACK | SUBMUCOSAL | Status: DC | PRN
  Administered 2018-02-07: 5 mL via SUBMUCOSAL

## 2018-02-07 MED ORDER — MIDAZOLAM HCL 5 MG/5ML IJ SOLN
INTRAMUSCULAR | Status: DC | PRN
  Administered 2018-02-07: 2 mg via INTRAVENOUS

## 2018-02-07 MED ORDER — PROPOFOL 10 MG/ML IV BOLUS
INTRAVENOUS | Status: DC | PRN
  Administered 2018-02-07: 50 mg via INTRAVENOUS
  Administered 2018-02-07: 20 mg via INTRAVENOUS

## 2018-02-07 MED ORDER — LACTATED RINGERS IV SOLN
INTRAVENOUS | Status: DC
  Administered 2018-02-07: 13:00:00 via INTRAVENOUS

## 2018-02-07 NOTE — OR Nursing (Signed)
DR Oneida Alar aware of patient's complaint of pain.  She states it is likely cramping and he can be discharged.

## 2018-02-07 NOTE — Op Note (Signed)
[facility] Patient Name: Garrett Mitchell Procedure Date: 02/07/2018 1:00 PM MRN: [id number] Date of Birth: 06/05/1960 Attending MD: Barney Drain MD, MD CSN: [id number] Age: 57 Admit Type: Outpatient Procedure:                Colonoscopy-EMR, COLD SNARE/SNARE CAUTERY                            POLYPECTOMY, INJECTION/CLIP x1 Indications:              Personal history of colonic polyps Providers:                Barney Drain MD, MD, Rosina Lowenstein, RN, Nelma Rothman,                            Technician Referring MD:             Fuller Canada Manuella Ghazi MD, MD Medicines:                Propofol per Anesthesia Complications:            No immediate complications. Estimated Blood Loss:     Estimated blood loss was minimal. Procedure:                Pre-Anesthesia Assessment:                           - Prior to the procedure, a History and Physical                            was performed, and patient medications and                            allergies were reviewed. The patient's tolerance of                            previous anesthesia was also reviewed. The risks                            and benefits of the procedure and the sedation                            options and risks were discussed with the patient.                            All questions were answered, and informed consent                            was obtained. Prior Anticoagulants: The patient has                            taken aspirin, last dose was 1 day prior to                            procedure. ASA Grade Assessment: II - A patient  with mild systemic disease. After reviewing the                            risks and benefits, the patient was deemed in                            satisfactory condition to undergo the procedure.                            After obtaining informed consent, the colonoscope                            was passed under direct vision. Throughout the           procedure, the patient's blood pressure, pulse, and                            oxygen saturations were monitored continuously. The                            CF-HQ190L ([id number]) scope was introduced through                            the anus and advanced to the the cecum, identified                            by appendiceal orifice and ileocecal valve. The                            colonoscopy was somewhat difficult due to a                            tortuous colon. Successful completion of the                            procedure was aided by straightening and shortening                            the scope to obtain bowel loop reduction and                            COLOWRAP. The patient tolerated the procedure well.                            The quality of the bowel preparation was good. The                            ileocecal valve, appendiceal orifice, and rectum                            were photographed. Scope In: 2:01:28 PM Scope Out: 2:48:40 PM Scope Withdrawal Time: 0 hours 45 minutes 16 seconds  Total Procedure Duration: 0 hours 47 minutes 12 seconds  Findings:      A 8 to  20 mm polyp was found in the hepatic flexure(BTL1). The polyp was       semi-sessile. Area was successfully injected with 6 mL of a 1:10,000       solution of epinephrine for drug delivery. Area was successfully       injected with 8 mL ELEVIEW for a lift polypectomy. The polyp was removed       with a hot snare. PIECEMEAL ENDOSCOPIC MUCOSAL Resection and retrieval       were complete. Area(OPPOSITE WALL) was tattooed with an injection of 3       mL of Spot (carbon black). To prevent bleeding after the polypectomy,       one hemostatic clip was successfully placed (MR conditional). There was       no bleeding at the end of the procedure.      THREE sessile polyps were found in the rectum(1), sigmoid colon(1) and       descending colon(1). The polyps were 3 to 5 mm in size. These polyps        were removed with a hot snare. Resection and retrieval were complete.      SEVEN sessile polyps were found in the sigmoid colon(1), descending       colon(2: BTL 2), transverse colon(3: BTL 2 and ascending colon(1: BTL       2). The polyps were 5 to 7 mm in size. These polyps were removed with a       cold snare. Resection and retrieval were complete.      Internal hemorrhoids were found. The hemorrhoids were moderate.      The recto-sigmoid colon and sigmoid colon were mildly redundant. Impression:               - One LARGE POLYP REMOVED at the hepatic flexure.                           - TEN POLYPS REMOVED FROM THE rectum(2), in the                            sigmoid colon(2), ASCENDING COLON(1), TRANSVERSE                            COLON(3) and in the descending colon(2)                           - MODERATE Internal hemorrhoids.                           - Redundant LEFT colon. Moderate Sedation:      Per Anesthesia Care Recommendation:           - Patient has a contact number available for                            emergencies. The signs and symptoms of potential                            delayed complications were discussed with the                            patient. Return to  normal activities tomorrow.                            Written discharge instructions were provided to the                            patient.                           - NEEDS KUB PRIOR TO MRI                           - High fiber diet.                           - Continue present medications.                           - Await pathology results.                           - Repeat colonoscopy in 1 year for surveillance                            after piecemeal polypectomy. Procedure Code(s):        --- Professional ---                           [id number], Colonoscopy, flexible; with removal of                            tumor(s), polyp(s), or other lesion(s) by snare                            technique                            45381, Colonoscopy, flexible; with directed                            submucosal injection(s), any substance Diagnosis Code(s):        --- Professional ---                           D12.3, Benign neoplasm of transverse colon (hepatic                            flexure or splenic flexure)                           K62.1, Rectal polyp                           D12.5, Benign neoplasm of sigmoid colon                           D12.4, Benign neoplasm of descending colon  D12.2, Benign neoplasm of ascending colon                           K64.8, Other hemorrhoids                           Z86.010, Personal history of colonic polyps                           Q43.8, Other specified congenital malformations of                            intestine CPT copyright 2018 American Medical Association. All rights reserved. The codes documented in this report are preliminary and upon coder review may  be revised to meet current compliance requirements. Barney Drain, MD Barney Drain MD, MD 02/07/2018 3:27:32 PM This report has been signed electronically. Number of Addenda: 0

## 2018-02-07 NOTE — Anesthesia Postprocedure Evaluation (Signed)
Anesthesia Post Note  Patient: Garrett Mitchell  Procedure(s) Performed: COLONOSCOPY WITH PROPOFOL (N/A ) ESOPHAGOGASTRODUODENOSCOPY (EGD) WITH PROPOFOL (N/A ) POLYPECTOMY BIOPSY  Patient location during evaluation: PACU Anesthesia Type: MAC Level of consciousness: awake and alert and oriented Pain management: pain level controlled Vital Signs Assessment: post-procedure vital signs reviewed and stable Respiratory status: spontaneous breathing Cardiovascular status: blood pressure returned to baseline and stable Postop Assessment: no apparent nausea or vomiting Anesthetic complications: no     Last Vitals:  Vitals:   02/07/18 1310 02/07/18 1313  BP: (!) 185/108   Resp: 17   Temp:  37.1 C  SpO2: 98%     Last Pain:  Vitals:   02/07/18 1506  TempSrc:   PainSc: 0-No pain                 Deno Sida

## 2018-02-07 NOTE — Op Note (Signed)
[facility] Patient Name: Garrett Mitchell Procedure Date: 02/07/2018 2:53 PM MRN: [id number] Date of Birth: April 08, 1961 Attending MD: Barney Drain MD, MD CSN: [id number] Age: 57 Admit Type: Outpatient Procedure:                Upper GI endoscopy WITH COLD FORCEPS BIOPSY Indications:              Esophageal reflux, Screening for Barrett's                            esophagus in patient at risk for this condition Providers:                Barney Drain MD, MD, Rosina Lowenstein, RN, Nelma Rothman,                            Technician Referring MD:             Fuller Canada. Manuella Ghazi MD, MD Medicines:                Propofol per Anesthesia Complications:            No immediate complications. Estimated Blood Loss:     Estimated blood loss was minimal. Procedure:                Pre-Anesthesia Assessment:                           - Prior to the procedure, a History and Physical                            was performed, and patient medications and                            allergies were reviewed. The patient's tolerance of                            previous anesthesia was also reviewed. The risks                            and benefits of the procedure and the sedation                            options and risks were discussed with the patient.                            All questions were answered, and informed consent                            was obtained. Prior Anticoagulants: The patient has                            taken aspirin, last dose was 1 day prior to                            procedure. ASA Grade Assessment: II - A patient  with mild systemic disease. After reviewing the                            risks and benefits, the patient was deemed in                            satisfactory condition to undergo the procedure.                            After obtaining informed consent, the endoscope was                            passed under direct vision.  Throughout the                            procedure, the patient's blood pressure, pulse, and                            oxygen saturations were monitored continuously. The                            GIF-H190 ([id number]) was introduced through the                            mouth, and advanced to the second part of duodenum.                            The upper GI endoscopy was accomplished without                            difficulty. The patient tolerated the procedure                            well. Scope In: 2:57:09 PM Scope Out: 3:01:08 PM Total Procedure Duration: 0 hours 3 minutes 59 seconds  Findings:      The examined esophagus was normal.      Diffuse moderate inflammation characterized by congestion (edema),       erosions and erythema was found in the entire examined stomach. Biopsies       were taken with a cold forceps for Helicobacter pylori testing.      A small hiatal hernia was present.      The examined duodenum was normal. Impression:               - Normal esophagus. NO BARRETT'S ESOPHAGUS                           - MODERATE Gastritis. Biopsied.                           - Small hiatal hernia. Moderate Sedation:      Per Anesthesia Care Recommendation:           - Patient has a contact number available for  emergencies. The signs and symptoms of potential                            delayed complications were discussed with the                            patient. Return to normal activities tomorrow.                            Written discharge instructions were provided to the                            patient.                           - High fiber diet and low fat diet.                           - Continue present medications.                           - Await pathology results.                           - Return to my office after studies are complete. Procedure Code(s):        --- Professional ---                           [phone],  Esophagogastroduodenoscopy, flexible,                            transoral; with biopsy, single or multiple Diagnosis Code(s):        --- Professional ---                           K29.70, Gastritis, unspecified, without bleeding                           K44.9, Diaphragmatic hernia without obstruction or                            gangrene                           K21.9, Gastro-esophageal reflux disease without                            esophagitis                           Z13.810, Encounter for screening for upper                            gastrointestinal disorder CPT copyright 2018 American Medical Association. All rights reserved. The codes documented in this report are preliminary and upon coder review may  be revised to meet current compliance requirements. Barney Drain, MD Barney Drain MD, MD 02/07/2018  3:31:26 PM This report has been signed electronically. Number of Addenda: 0

## 2018-02-07 NOTE — Anesthesia Preprocedure Evaluation (Signed)
Anesthesia Evaluation  Patient identified by MRN, date of birth, ID band Patient awake    Reviewed: Allergy & Precautions, H&P , NPO status , Patient's Chart, lab work & pertinent test results, reviewed documented beta blocker date and time   Airway Mallampati: II  TM Distance: >3 FB Neck ROM: full    Dental no notable dental hx. (+) Edentulous Upper, Edentulous Lower   Pulmonary COPD, Current Smoker,    Pulmonary exam normal breath sounds clear to auscultation       Cardiovascular Exercise Tolerance: Good hypertension, negative cardio ROS   Rhythm:regular Rate:Normal     Neuro/Psych  Headaches, Anxiety CVA negative psych ROS   GI/Hepatic negative GI ROS, (+) Hepatitis -, C  Endo/Other  negative endocrine ROSdiabetes  Renal/GU negative Renal ROS  negative genitourinary   Musculoskeletal   Abdominal   Peds  Hematology negative hematology ROS (+)   Anesthesia Other Findings   Reproductive/Obstetrics negative OB ROS                             Anesthesia Physical Anesthesia Plan  ASA: III  Anesthesia Plan: MAC   Post-op Pain Management:    Induction:   PONV Risk Score and Plan:   Airway Management Planned:   Additional Equipment:   Intra-op Plan:   Post-operative Plan:   Informed Consent: I have reviewed the patients History and Physical, chart, labs and discussed the procedure including the risks, benefits and alternatives for the proposed anesthesia with the patient or authorized representative who has indicated his/her understanding and acceptance.     Plan Discussed with: CRNA  Anesthesia Plan Comments:         Anesthesia Quick Evaluation

## 2018-02-07 NOTE — Transfer of Care (Signed)
Immediate Anesthesia Transfer of Care Note  Patient: Garrett Mitchell  Procedure(s) Performed: COLONOSCOPY WITH PROPOFOL (N/A ) ESOPHAGOGASTRODUODENOSCOPY (EGD) WITH PROPOFOL (N/A ) POLYPECTOMY BIOPSY  Patient Location: PACU  Anesthesia Type:MAC  Level of Consciousness: awake and alert   Airway & Oxygen Therapy: Patient Spontanous Breathing  Post-op Assessment: Report given to RN  Post vital signs: Reviewed and stable  Last Vitals:  Vitals Value Taken Time  BP    Temp    Pulse 93 02/07/2018  3:12 PM  Resp 17 02/07/2018  3:12 PM  SpO2 96 % 02/07/2018  3:12 PM  Vitals shown include unvalidated device data.  Last Pain:  Vitals:   02/07/18 1506  TempSrc:   PainSc: 0-No pain         Complications: No apparent anesthesia complications

## 2018-02-07 NOTE — H&P (Addendum)
Primary Care Physician:  Monico Blitz, MD Primary Gastroenterologist:  Dr. Oneida Alar  Pre-Procedure History & Physical: HPI:  Garrett Mitchell is a 57 y.o. male here for  PERSONAL HISTORY OF POLYPS/SCREEN FOR BARRETT'S.  Past Medical History:  Diagnosis Date  . Anxiety   . Aortic valve disease    Presumably bicuspid - limited information Pt states he has an irregular heartrate due to this  . Arthritis   . Chronic back pain   . COPD (chronic obstructive pulmonary disease) ([place])   . Dental caries   . Diabetes mellitus without complication (HCC)    Type 2, not on medications  . Essential hypertension   . Headache    bright lights cause headaches  . Hepatitis C antibody positive in blood 2011   HEP C RNA NEG JAN 2019  . History of stroke   . Hyperlipemia   . Insomnia   . Intussusception of colon ([place])    AGE 75  . Restless leg syndrome   . Stroke [facility]) 2009 or 2010   vision is blurry since having stroke;STM loss  . 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 BACK SURGERY     lumbar surgery  . BOWEL RESECTION     AGE 75  . CHOLECYSTECTOMY    . INTUSSUSCEPTION REPAIR N/A    At age 39 years  . MULTIPLE EXTRACTIONS WITH ALVEOLOPLASTY N/A 06/13/2015   Procedure: MULTIPLE EXTRACTIONS WITH ALVEOLOPLASTY;  Surgeon: Diona Browner, DDS;  Location: [facility];  Service: Oral Surgery;  Laterality: N/A;  . VASECTOMY      Prior to Admission medications   Medication Sig Start Date End Date Taking? Authorizing Provider  ALPRAZolam Duanne Moron) 0.5 MG tablet Take 0.5 mg by mouth 3 (three) times daily.  11/29/17  Yes [provider]  amitriptyline (ELAVIL) 50 MG tablet Take 50 mg by mouth at bedtime.   Yes [provider]  amLODipine (NORVASC) 10 MG tablet Take 10 mg by mouth every morning.  03/27/14  Yes [provider]  aspirin EC 81 MG EC tablet Take 1 tablet (81 mg total) by mouth daily. 04/02/14   Yes Black, Lezlie Octave, NP  brimonidine (ALPHAGAN P) 0.1 % SOLN Place 1 drop into both eyes 2 (two) times daily.   Yes [provider]  butalbital-aspirin-caffeine Acquanetta Chain) 50-325-40 MG capsule Take 1 capsule by mouth every 4 (four) hours as needed for headache.  04/28/17  Yes [provider]  Carboxymethylcellul-Glycerin (LUBRICATING EYE DROPS OP) Place 1 drop into both eyes daily as needed (dry eyes).   Yes [provider]  Cholecalciferol (VITAMIN D) 2000 units tablet Take 2,000 Units by mouth daily.   Yes [provider]  cloNIDine (CATAPRES) 0.2 MG tablet Take 0.2 mg by mouth 2 (two) times daily.   Yes [provider]  cyclobenzaprine (FLEXERIL) 10 MG tablet Take 10 mg by mouth every 8 (eight) hours as needed for muscle spasms. 12/28/17  Yes [provider]  docusate sodium (COLACE) 100 MG capsule Take 100 mg by mouth daily.   Yes [provider]  metoprolol tartrate (LOPRESSOR) 25 MG tablet Take 12.5 mg by mouth 2 (two) times daily.   Yes [provider]  morphine (MSIR) 30 MG tablet Take 30 mg by mouth 3 (three) times daily as needed for severe pain.    Yes [provider]  Multiple Vitamin (MULTIVITAMIN WITH  MINERALS) TABS tablet Take 1 tablet by mouth daily.   Yes [provider]  Na Sulfate-K Sulfate-Mg Sulf 17.5-3.13-1.6 GM/177ML SOLN Take 1 kit by mouth as directed. 12/07/17  Yes Jazline Cumbee, Marga Melnick, MD  NARCAN 4 MG/0.1ML LIQD nasal spray kit Place 1 spray into the nose as needed (opiod overdose).  03/22/17  Yes [provider]  Potassium 99 MG TABS Take 99 mg by mouth daily.   Yes [provider]  PROAIR HFA 108 (90 Base) MCG/ACT inhaler Inhale 2 puffs into the lungs every 4 (four) hours as needed for wheezing or shortness of breath.  03/20/17  Yes [provider]  rOPINIRole (REQUIP) 0.25 MG tablet Take 0.5 mg by mouth at bedtime.  03/27/14  Yes [provider]  sildenafil  (REVATIO) 20 MG tablet Take 100 mg by mouth as needed for erectile dysfunction. 12/06/17  Yes [provider]  tiZANidine (ZANAFLEX) 2 MG tablet Take 2 mg by mouth every 8 (eight) hours as needed for muscle spasms.  05/30/15  Yes [provider]  topiramate (TOPAMAX) 25 MG tablet Take 25 mg by mouth 2 (two) times daily.   Yes [provider]  traMADol (ULTRAM) 50 MG tablet Take 50-100 mg by mouth every 6 (six) hours as needed for moderate pain (for breakthrough pain).  02/27/14  Yes [provider]  triazolam (HALCION) 0.25 MG tablet Take 0.5 mg by mouth at bedtime.  02/27/14  Yes [provider]  lidocaine (XYLOCAINE) 2 % solution Use as directed 20 mLs in the mouth or throat as needed for mouth pain. Patient taking differently: Use as directed 20 mLs in the mouth or throat daily as needed for mouth pain.  06/07/15   Joy, Raquel Sarna C, PA-C    Allergies as of 12/08/2017  . (No Known Allergies)    Family History  Problem Relation Age of Onset  . CAD Father        Premature disease  . Asthma Mother   . Colon cancer Neg Hx   . Colon polyps Neg Hx     Social History   Socioeconomic History  . Marital status: Married    Spouse name: Not on file  . Number of children: Not on file  . Years of education: Not on file  . Highest education level: Not on file  Occupational History  . Occupation: Disabled  Social Needs  . Financial resource strain: Not on file  . Food insecurity:    Worry: Not on file    Inability: Not on file  . Transportation needs:    Medical: Not on file    Non-medical: Not on file  Tobacco Use  . Smoking status: Current Every Day Smoker    Packs/day: 1.00    Years: 40.00    Pack years: 40.00    Types: Cigarettes  . Smokeless tobacco: Never Used  Substance and Sexual Activity  . Alcohol use: Yes    Comment: Quit drinking 10 years ago as of 05/2015. Occas on special occasions  . Drug use: No    Comment: quit 15 years ago  05/04/17  . Sexual activity: Yes  Lifestyle  . Physical activity:    Days per week: Not on file    Minutes per session: Not on file  . Stress: Not on file  Relationships  . Social connections:    Talks on phone: Not on file    Gets together: Not on file    Attends religious service: Not  on file    Active member of club or organization: Not on file    Attends meetings of clubs or organizations: Not on file    Relationship status: Not on file  . Intimate partner violence:    Fear of current or ex partner: Not on file    Emotionally abused: Not on file    Physically abused: Not on file    Forced sexual activity: Not on file  Other Topics Concern  . Not on file  Social History Narrative  . Not on file    Review of Systems: See HPI, otherwise negative ROS   Physical Exam: BP (!) 185/108   Temp 98.7 F (37.1 C) (Oral)   Resp 17   SpO2 98%  General:   Alert,  pleasant and cooperative in NAD Head:  Normocephalic and atraumatic. Neck:  Supple; Lungs:  Clear throughout to auscultation.    Heart:  Regular rate and rhythm. Abdomen:  Soft, nontender and nondistended. Normal bowel sounds, without guarding, and without rebound.   Neurologic:  Alert and  oriented x4;  grossly normal neurologically.  Impression/Plan:     PERSONAL HISTORY OF POLYPS/SCREEN FOR BARRETT'S  Plan:  1. TCS/EGDTODAY DISCUSSED PROCEDURE, BENEFITS, & RISKS: < 1% chance of medication reaction, bleeding, perforation, or rupture of spleen/liver.

## 2018-02-08 NOTE — Progress Notes (Signed)
CC'D TO PCP °

## 2018-02-10 ENCOUNTER — Telehealth: Payer: Self-pay | Admitting: Gastroenterology

## 2018-02-10 NOTE — Discharge Instructions (Signed)
You had 10 polyps removed. ONE WAS LARGER AND I TATTOOED THE BASE. I PLACED A CLIP TO PREVENT BLEEDING IN 7-10 DAYS.  You have MODERATE internal hemorrhoids. You have gastritis & a SMALL HIATAL HERNIA. I biopsied your stomach.   IF YOU NEED AN MRI, LET THE RADIOLOGY TECH KNOW THAT YOU HAD TWO CLIPS PLACED IN YOUR COLON. THEY SHOULD FALL OFF IN 30 DAYS.   DRINK WATER TO KEEP YOUR URINE LIGHT YELLOW.  FOLLOW A HIGH FIBER/LOW FAT DIET. AVOID ITEMS THAT CAUSE BLOATING. SEE INFO BELOW.  YOUR BIOPSY RESULTS WILL BE BACK IN 5 BUSINESS DAYS.  Next colonoscopy in 1 year due to prior largepolyp removed in 2013 and polyp > 2 cm removed in 2019. YOUR SISTERS, BROTHERS, CHILDREN, AND PARENTS NEED TO HAVE A COLONOSCOPY STARTING AT THE AGE OF 40 AND EVERY 5 YEARS BECAUSE YOU HAD AN ADVANCED POLYP REMOVED PRIOR TO AGE 65.     ENDOSCOPY Care After Read the instructions outlined below and refer to this sheet in the next week. These discharge instructions provide you with general information on caring for yourself after you leave the hospital. While your treatment has been planned according to the most current medical practices available, unavoidable complications occasionally occur. If you have any problems or questions after discharge, call DR. Andrian Sabala, [phone].  ACTIVITY  You may resume your regular activity, but move at a slower pace for the next 24 hours.   Take frequent rest periods for the next 24 hours.   Walking will help get rid of the air and reduce the bloated feeling in your belly (abdomen).   No driving for 24 hours (because of the medicine (anesthesia) used during the test).   You may shower.   Do not sign any important legal documents or operate any machinery for 24 hours (because of the anesthesia used during the test).    NUTRITION  Drink plenty of fluids.   You may resume your normal diet as instructed by your doctor.   Begin with a light meal and progress to your normal  diet. Heavy or fried foods are harder to digest and may make you feel sick to your stomach (nauseated).   Avoid alcoholic beverages for 24 hours or as instructed.    MEDICATIONS  You may resume your normal medications.   WHAT YOU CAN EXPECT TODAY  Some feelings of bloating in the abdomen.   Passage of more gas than usual.   Spotting of blood in your stool or on the toilet paper  .  IF YOU HAD POLYPS REMOVED DURING THE ENDOSCOPY:  Eat a soft diet IF YOU HAVE NAUSEA, BLOATING, ABDOMINAL PAIN, OR VOMITING.    FINDING OUT THE RESULTS OF YOUR TEST Not all test results are available during your visit. DR. Oneida Alar WILL CALL YOU WITHIN 14 DAYS OF YOUR PROCEDUE WITH YOUR RESULTS. Do not assume everything is normal if you have not heard from DR. Giuseppe Duchemin, CALL HER OFFICE AT [phone].  SEEK IMMEDIATE MEDICAL ATTENTION AND CALL THE OFFICE: [phone] IF:  You have more than a spotting of blood in your stool.   Your belly is swollen (abdominal distention).   You are nauseated or vomiting.   You have a temperature over 101F.   You have abdominal pain or discomfort that is severe or gets worse throughout the day.   Gastritis  Gastritis is an inflammation (the body's way of reacting to injury and/or infection) of the stomach. It is often caused by viral  or bacterial (germ) infections. It can also be caused BY ASPIRIN, BC/GOODY POWDER'S, (IBUPROFEN) MOTRIN, OR ALEVE (NAPROXEN), chemicals (including alcohol), SPICY FOODS, and medications. This illness may be associated with generalized malaise (feeling tired, not well), UPPER ABDOMINAL STOMACH cramps, and fever. One common bacterial cause of gastritis is an organism known as H. Pylori. This can be treated with antibiotics.    Hiatal Hernia A hiatal hernia occurs when a part of the stomach slides above the diaphragm. The diaphragm is the thin muscle separating the belly (abdomen) from the chest. A hiatal hernia can be something you are  born with or develop over time. Hiatal hernias may allow stomach acid to flow back into your esophagus, the tube which carries food from your mouth to your stomach. If this acid causes problems it is called GERD (gastro-esophageal reflux disease).    High-Fiber Diet A high-fiber diet changes your normal diet to include more whole grains, legumes, fruits, and vegetables. Changes in the diet involve replacing refined carbohydrates with unrefined foods. The calorie level of the diet is essentially unchanged. The Dietary Reference Intake (recommended amount) for adult males is 38 grams per day. For adult females, it is 25 grams per day. Pregnant and lactating women should consume 28 grams of fiber per day. Fiber is the intact part of a plant that is not broken down during digestion. Functional fiber is fiber that has been isolated from the plant to provide a beneficial effect in the body.  PURPOSE  Increase stool bulk.   Ease and regulate bowel movements.   Lower cholesterol.   REDUCE RISK OF COLON CANCER  INDICATIONS THAT YOU NEED MORE FIBER  Constipation and hemorrhoids.   Uncomplicated diverticulosis (intestine condition) and irritable bowel syndrome.   Weight management.   As a protective measure against hardening of the arteries (atherosclerosis), diabetes, and cancer.   GUIDELINES FOR INCREASING FIBER IN THE DIET  Start adding fiber to the diet slowly. A gradual increase of about 5 more grams (2 slices of whole-wheat bread, 2 servings of most fruits or vegetables, or 1 bowl of high-fiber cereal) per day is best. Too rapid an increase in fiber may result in constipation, flatulence, and bloating.   Drink enough water and fluids to keep your urine clear or pale yellow. Water, juice, or caffeine-free drinks are recommended. Not drinking enough fluid may cause constipation.   Eat a variety of high-fiber foods rather than one type of fiber.   Try to increase your intake of fiber  through using high-fiber foods rather than fiber pills or supplements that contain small amounts of fiber.   The goal is to change the types of food eaten. Do not supplement your present diet with high-fiber foods, but replace foods in your present diet.   INCLUDE A VARIETY OF FIBER SOURCES  Replace refined and processed grains with whole grains, canned fruits with fresh fruits, and incorporate other fiber sources. White rice, white breads, and most bakery goods contain little or no fiber.   Brown whole-grain rice, buckwheat oats, and many fruits and vegetables are all good sources of fiber. These include: broccoli, Brussels sprouts, cabbage, cauliflower, beets, sweet potatoes, white potatoes (skin on), carrots, tomatoes, eggplant, squash, berries, fresh fruits, and dried fruits.   Cereals appear to be the richest source of fiber. Cereal fiber is found in whole grains and bran. Bran is the fiber-rich outer coat of cereal grain, which is largely removed in refining. In whole-grain cereals, the bran remains. In breakfast  cereals, the largest amount of fiber is found in those with "bran" in their names. The fiber content is sometimes indicated on the label.   You may need to include additional fruits and vegetables each day.   In baking, for 1 cup white flour, you may use the following substitutions:   1 cup whole-wheat flour minus 2 tablespoons.   1/2 cup white flour plus 1/2 cup whole-wheat flour.   Polyps, Colon  A polyp is extra tissue that grows inside your body. Colon polyps grow in the large intestine. The large intestine, also called the colon, is part of your digestive system. It is a long, hollow tube at the end of your digestive tract where your body makes and stores stool. Most polyps are not dangerous. They are benign. This means they are not cancerous. But over time, some types of polyps can turn into cancer. Polyps that are smaller than a pea are usually not harmful. But larger  polyps could someday become or may already be cancerous. To be safe, doctors remove all polyps and test them.   WHO GETS POLYPS? Anyone can get polyps, but certain people are more likely than others. You may have a greater chance of getting polyps if:  You are over 50.   You have had polyps before.   Someone in your family has had polyps.   Someone in your family has had cancer of the large intestine.   Find out if someone in your family has had polyps. You may also be more likely to get polyps if you:   Eat a lot of fatty foods   Smoke   Drink alcohol   Do not exercise  Eat too much   PREVENTION There is not one sure way to prevent polyps. You might be able to lower your risk of getting them if you:  Eat more fruits and vegetables and less fatty food.   Do not smoke.   Avoid alcohol.   Exercise every day.   Lose weight if you are overweight.   Eating more calcium and folate can also lower your risk of getting polyps. Some foods that are rich in calcium are milk, cheese, and broccoli. Some foods that are rich in folate are chickpeas, kidney beans, and spinach.

## 2018-02-10 NOTE — Telephone Encounter (Signed)
LMOM to call and that we do leave at noon today.

## 2018-02-10 NOTE — Telephone Encounter (Signed)
  Please call pt. He had TEN simple adenomas removed. His stomach Bx shows mild gastritis.   IF YOU NEED AN MRI, LET THE RADIOLOGY TECH KNOW THAT YOU HAD TWO CLIPS PLACED IN YOUR COLON. THEY SHOULD FALL OFF IN 30 DAYS.   DRINK WATER TO KEEP YOUR URINE LIGHT YELLOW.  FOLLOW A HIGH FIBER/LOW FAT DIET. AVOID ITEMS THAT CAUSE BLOATING.   Next colonoscopy in 3 years not 1 years because thelargeremoved in 2019 was a simple adenoma without advanced changes. YOUR SISTERS, BROTHERS, CHILDREN, AND PARENTS NEED TO HAVE A COLONOSCOPY STARTING AT THE AGE OF 40 AND EVERY 5 YEARS BECAUSE YOU HAD AN ADVANCED POLYP REMOVED PRIOR TO AGE 76.   You should consider testing for COLON POLYP  SYNDROMES THAT CAN BE PASSED ON FROM GENERATION TO GENERATION (GENETIC TESTING).

## 2018-02-13 ENCOUNTER — Telehealth: Payer: Self-pay

## 2018-02-13 NOTE — Telephone Encounter (Signed)
PT called and is aware of his results and plan.

## 2018-02-13 NOTE — Telephone Encounter (Signed)
PT is aware of results and plan.  

## 2018-02-17 ENCOUNTER — Encounter (HOSPITAL_COMMUNITY): Payer: Self-pay | Admitting: Gastroenterology

## 2018-03-01 ENCOUNTER — Telehealth: Payer: Self-pay | Admitting: Gastroenterology

## 2018-03-01 NOTE — Telephone Encounter (Signed)
Per [facility] recommendations in procedure report on 02/07/2018 patient needs KUB prior to MRI

## 2018-03-01 NOTE — Telephone Encounter (Signed)
See result note 02/10/18.

## 2018-03-20 NOTE — Progress Notes (Deleted)
Cardiology Office Note   Date:  03/20/2018   ID:  Garrett Mitchell, DOB May 24, 1960, MRN [id number]  PCP:  Monico Blitz, MD  Cardiologist:   Jenkins Rouge, MD   No chief complaint on file.     History of Present Illness: Garrett Mitchell is a 57 y.o. male who presents for consultation regarding abnormal ECG Referred by Dr Manuella Ghazi Seen by Dr Domenic Polite in 2015 and Dr Harl Bowie in 2016.  ECG then noted to show LVH with strain. Normal myovue 03/2014 no ischemia EF 58% History of HTN.  He is a smoker. There is a question of murmur and bicuspid AV But no TTE in [facility] system Was to have endo for screeing on 02/01/18 and cancelled due to ECG changes  Reviewed and had PVC but LVH with strain chronic and seen on ECG in 2015  ***    Past Medical History:  Diagnosis Date  . Anxiety   . Aortic valve disease    Presumably bicuspid - limited information Pt states he has an irregular heartrate due to this  . Arthritis   . Chronic back pain   . COPD (chronic obstructive pulmonary disease) ([place])   . Dental caries   . Diabetes mellitus without complication (HCC)    Type 2, not on medications  . Essential hypertension   . Headache    bright lights cause headaches  . Hepatitis C antibody positive in blood 2011   HEP C RNA NEG JAN 2019  . History of stroke   . Hyperlipemia   . Insomnia   . Intussusception of colon ([place])    AGE 75  . Restless leg syndrome   . Stroke [facility]) 2009 or 2010   vision is blurry since having stroke;STM loss  . 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 BACK SURGERY     lumbar surgery  . BIOPSY  02/07/2018   Procedure: BIOPSY;  Surgeon: Danie Binder, MD;  [facility];  Service: Endoscopy;;  gastric bx's  . BOWEL RESECTION     AGE 75  . CHOLECYSTECTOMY    . COLONOSCOPY WITH PROPOFOL N/A 02/07/2018   Procedure: COLONOSCOPY WITH PROPOFOL;  Surgeon: Danie Binder, MD;   [facility];  Service: Endoscopy;  Laterality: N/A;  2:00pm  . ESOPHAGOGASTRODUODENOSCOPY (EGD) WITH PROPOFOL N/A 02/07/2018   Procedure: ESOPHAGOGASTRODUODENOSCOPY (EGD) WITH PROPOFOL;  Surgeon: Danie Binder, MD;  [facility];  Service: Endoscopy;  Laterality: N/A;  . INTUSSUSCEPTION REPAIR N/A    At age 56 years  . MULTIPLE EXTRACTIONS WITH ALVEOLOPLASTY N/A 06/13/2015   Procedure: MULTIPLE EXTRACTIONS WITH ALVEOLOPLASTY;  Surgeon: Diona Browner, DDS;  Location: [place];  Service: Oral Surgery;  Laterality: N/A;  . POLYPECTOMY  02/07/2018   Procedure: POLYPECTOMY;  Surgeon: Danie Binder, MD;  [facility];  Service: Endoscopy;;  ascending colon polyp, hepatic flexure polyp, transverse colon polyps x3, descending colon polyp, sigmoid colon polyps x2, rectal polyps x2  . VASECTOMY       Current Outpatient Medications  Medication Sig Dispense Refill  . ALPRAZolam (XANAX) 0.5 MG tablet Take 0.5 mg by mouth 3 (three) times daily.   1  . amitriptyline (ELAVIL) 50 MG tablet Take 50 mg by mouth at bedtime.    Marland Kitchen amLODipine (NORVASC) 10 MG tablet Take 10 mg by mouth every morning.     Marland Kitchen  aspirin EC 81 MG EC tablet Take 1 tablet (81 mg total) by mouth daily.    . brimonidine (ALPHAGAN P) 0.1 % SOLN Place 1 drop into both eyes 2 (two) times daily.    . butalbital-aspirin-caffeine (FIORINAL) 50-325-40 MG capsule Take 1 capsule by mouth every 4 (four) hours as needed for headache.   1  . Carboxymethylcellul-Glycerin (LUBRICATING EYE DROPS OP) Place 1 drop into both eyes daily as needed (dry eyes).    . Cholecalciferol (VITAMIN D) 2000 units tablet Take 2,000 Units by mouth daily.    . cloNIDine (CATAPRES) 0.2 MG tablet Take 0.2 mg by mouth 2 (two) times daily.    . cyclobenzaprine (FLEXERIL) 10 MG tablet Take 10 mg by mouth every 8 (eight) hours as needed for muscle spasms.  1  . docusate sodium (COLACE) 100 MG capsule Take 100 mg by mouth daily.    Marland Kitchen lidocaine  (XYLOCAINE) 2 % solution Use as directed 20 mLs in the mouth or throat as needed for mouth pain. (Patient taking differently: Use as directed 20 mLs in the mouth or throat daily as needed for mouth pain. ) 100 mL 0  . metoprolol tartrate (LOPRESSOR) 25 MG tablet Take 12.5 mg by mouth 2 (two) times daily.    Marland Kitchen morphine (MSIR) 30 MG tablet Take 30 mg by mouth 3 (three) times daily as needed for severe pain.     . Multiple Vitamin (MULTIVITAMIN WITH MINERALS) TABS tablet Take 1 tablet by mouth daily.    Marland Kitchen NARCAN 4 MG/0.1ML LIQD nasal spray kit Place 1 spray into the nose as needed (opiod overdose).   99  . Potassium 99 MG TABS Take 99 mg by mouth daily.    Marland Kitchen PROAIR HFA 108 (90 Base) MCG/ACT inhaler Inhale 2 puffs into the lungs every 4 (four) hours as needed for wheezing or shortness of breath.   5  . rOPINIRole (REQUIP) 0.25 MG tablet Take 0.5 mg by mouth at bedtime.     . sildenafil (REVATIO) 20 MG tablet Take 100 mg by mouth as needed for erectile dysfunction.  6  . tiZANidine (ZANAFLEX) 2 MG tablet Take 2 mg by mouth every 8 (eight) hours as needed for muscle spasms.     Marland Kitchen topiramate (TOPAMAX) 25 MG tablet Take 25 mg by mouth 2 (two) times daily.    . traMADol (ULTRAM) 50 MG tablet Take 50-100 mg by mouth every 6 (six) hours as needed for moderate pain (for breakthrough pain).     . triazolam (HALCION) 0.25 MG tablet Take 0.5 mg by mouth at bedtime.      No current facility-administered medications for this visit.     Allergies:   Patient has no known allergies.    Social History:  The patient  reports that he has been smoking cigarettes. He has a 40.00 pack-year smoking history. He has never used smokeless tobacco. He reports that he drinks alcohol. He reports that he does not use drugs.   Family History:  The patient's family history includes Asthma in his mother; CAD in his father.    ROS:  Please see the history of present illness.   Otherwise, review of systems are positive for none.    All other systems are reviewed and negative.    PHYSICAL EXAM: VS:  There were no vitals taken for this visit. , BMI There is no height or weight on file to calculate BMI. Affect appropriate Healthy:  appears stated age 4: normal Neck supple  with no adenopathy JVP normal no bruits no thyromegaly Lungs clear with no wheezing and good diaphragmatic motion Heart:  S1/S2 no murmur, no rub, gallop or click PMI normal Abdomen: benighn, BS positve, no tenderness, no AAA no bruit.  No HSM or HJR Distal pulses intact with no bruits No edema Neuro non-focal Skin warm and dry No muscular weakness    EKG:  SR rate 73 PVC LVH with starin    Recent Labs: 05/11/2017: ALT 26 02/01/2018: BUN 11; Creatinine, Ser 0.85; Hemoglobin 13.7; Platelets 264; Potassium 4.1; Sodium 138    Lipid Panel    Component Value Date/Time   CHOL 103 03/29/2014 1255   TRIG 183 (H) 03/29/2014 1255   HDL 37 (L) 03/29/2014 1255   CHOLHDL 2.8 03/29/2014 1255   VLDL 37 03/29/2014 1255   LDLCALC 29 03/29/2014 1255      Wt Readings from Last 3 Encounters:  02/01/18 210 lb (95.3 kg)  12/07/17 207 lb (93.9 kg)  05/04/17 207 lb 9.6 oz (94.2 kg)      Other studies Reviewed: Additional studies/ records that were reviewed today include: Notes Dr Domenic Polite , Dr Harl Bowie Notes primary Dr Teresa Coombs and myovue from 2016.    ASSESSMENT AND PLAN:  1.  Abnormal ECG:  Chronic with LVH strain f/u TTE to assess LVH and EF 2. Murmur :  ? History of bicuspid AV will check TTE to resolve issue of morphology 3.  HTN:  Well controlled.  Continue current medications and low sodium Dash type diet.   5. Smoking:  Counseled on cessation for less than 10 minutes needs f/u CXR with primary    Current medicines are reviewed at length with the patient today.  The patient does not have concerns regarding medicines.  The following changes have been made:  ***  Labs/ tests ordered today include: TTE  *** No orders of the  defined types were placed in this encounter.    Disposition:   FU with cardiology Dr Domenic Polite or [place] in a year      Signed, Jenkins Rouge, MD  03/20/2018 9:46 AM    [address], [place], [place]  [address] Phone: [phone]; Fax: [phone]

## 2018-03-21 ENCOUNTER — Ambulatory Visit: Payer: Medicare Other | Admitting: Cardiovascular Disease

## 2018-04-28 ENCOUNTER — Ambulatory Visit: Payer: Medicare Other | Admitting: Cardiovascular Disease

## 2018-05-02 ENCOUNTER — Encounter: Payer: Self-pay | Admitting: Cardiology

## 2018-05-02 ENCOUNTER — Ambulatory Visit: Payer: Medicare Other | Admitting: Cardiology

## 2018-05-02 NOTE — Progress Notes (Deleted)
Cardiology Office Note  Date: 05/02/2018   ID: THADIUS SMISEK, DOB 09/18/1960, MRN [id number]  PCP: Monico Blitz, MD  Consulting Cardiologist: Rozann Lesches, MD   No chief complaint on file.   History of Present Illness: Garrett Mitchell is a 58 y.o. male referred for cardiology consultation by Dr. Oneida Alar due to abnormal screening ECG done in preparation for elective endoscopy (already completed in October 2019).  Tracing in question was from October 2019 as outlined below.  I reviewed his previous tracing from 2017 as well.  He has had LVH with repolarization abnormalities fairly consistently.   He reportedly had an echocardiogram done back in 2015 although I cannot access the formal report.  Based on a rounding note by Dr. Harl Bowie from that time the patient had moderate to severe LVH with LVEF 70 to 75%, no wall motion abnormalities, grade 2 diastolic dysfunction, SAM in the setting of LVH with mild subvalvular gradient, uncertain number of aortic valve cusps but no stenosis.   Past Medical History:  Diagnosis Date  . Anxiety   . Aortic valve disease    Presumably bicuspid - limited information  . Arthritis   . Chronic back pain   . COPD (chronic obstructive pulmonary disease) ([place])   . Dental caries   . Essential hypertension   . Headache   . Hepatitis C antibody positive in blood 2011   HEP C RNA NEG JAN 2019  . History of stroke   . Hyperlipemia   . Insomnia   . Intussusception of colon ([place])    Age 52  . Restless leg syndrome   . 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 Type 2 diabetes mellitus ([place])     Past Surgical History:  Procedure Laterality Date  . BACK SURGERY     lumbar surgery  . BIOPSY  02/07/2018   Procedure: BIOPSY;  Surgeon: Danie Binder, MD;  [facility];  Service: Endoscopy;;  gastric bx's  . BOWEL RESECTION     AGE 37  . CHOLECYSTECTOMY    . COLONOSCOPY WITH PROPOFOL N/A 02/07/2018   Procedure:  COLONOSCOPY WITH PROPOFOL;  Surgeon: Danie Binder, MD;  [facility];  Service: Endoscopy;  Laterality: N/A;  2:00pm  . ESOPHAGOGASTRODUODENOSCOPY (EGD) WITH PROPOFOL N/A 02/07/2018   Procedure: ESOPHAGOGASTRODUODENOSCOPY (EGD) WITH PROPOFOL;  Surgeon: Danie Binder, MD;  [facility];  Service: Endoscopy;  Laterality: N/A;  . INTUSSUSCEPTION REPAIR N/A    At age 50 years  . MULTIPLE EXTRACTIONS WITH ALVEOLOPLASTY N/A 06/13/2015   Procedure: MULTIPLE EXTRACTIONS WITH ALVEOLOPLASTY;  Surgeon: Diona Browner, DDS;  Location: [place];  Service: Oral Surgery;  Laterality: N/A;  . POLYPECTOMY  02/07/2018   Procedure: POLYPECTOMY;  Surgeon: Danie Binder, MD;  [facility];  Service: Endoscopy;;  ascending colon polyp, hepatic flexure polyp, transverse colon polyps x3, descending colon polyp, sigmoid colon polyps x2, rectal polyps x2  . VASECTOMY      Current Outpatient Medications  Medication Sig Dispense Refill  . ALPRAZolam (XANAX) 0.5 MG tablet Take 0.5 mg by mouth 3 (three) times daily.   1  . amitriptyline (ELAVIL) 50 MG tablet Take 50 mg by mouth at bedtime.    Marland Kitchen amLODipine (NORVASC) 10 MG tablet Take 10 mg by mouth every morning.     Marland Kitchen aspirin EC 81 MG EC tablet Take 1 tablet (81 mg total) by mouth daily.    Marland Kitchen  brimonidine (ALPHAGAN P) 0.1 % SOLN Place 1 drop into both eyes 2 (two) times daily.    . butalbital-aspirin-caffeine (FIORINAL) 50-325-40 MG capsule Take 1 capsule by mouth every 4 (four) hours as needed for headache.   1  . Carboxymethylcellul-Glycerin (LUBRICATING EYE DROPS OP) Place 1 drop into both eyes daily as needed (dry eyes).    . Cholecalciferol (VITAMIN D) 2000 units tablet Take 2,000 Units by mouth daily.    . cloNIDine (CATAPRES) 0.2 MG tablet Take 0.2 mg by mouth 2 (two) times daily.    . cyclobenzaprine (FLEXERIL) 10 MG tablet Take 10 mg by mouth every 8 (eight) hours as needed for muscle spasms.  1  . docusate sodium (COLACE) 100 MG  capsule Take 100 mg by mouth daily.    Marland Kitchen lidocaine (XYLOCAINE) 2 % solution Use as directed 20 mLs in the mouth or throat as needed for mouth pain. (Patient taking differently: Use as directed 20 mLs in the mouth or throat daily as needed for mouth pain. ) 100 mL 0  . metoprolol tartrate (LOPRESSOR) 25 MG tablet Take 12.5 mg by mouth 2 (two) times daily.    Marland Kitchen morphine (MSIR) 30 MG tablet Take 30 mg by mouth 3 (three) times daily as needed for severe pain.     . Multiple Vitamin (MULTIVITAMIN WITH MINERALS) TABS tablet Take 1 tablet by mouth daily.    Marland Kitchen NARCAN 4 MG/0.1ML LIQD nasal spray kit Place 1 spray into the nose as needed (opiod overdose).   99  . Potassium 99 MG TABS Take 99 mg by mouth daily.    Marland Kitchen PROAIR HFA 108 (90 Base) MCG/ACT inhaler Inhale 2 puffs into the lungs every 4 (four) hours as needed for wheezing or shortness of breath.   5  . rOPINIRole (REQUIP) 0.25 MG tablet Take 0.5 mg by mouth at bedtime.     . sildenafil (REVATIO) 20 MG tablet Take 100 mg by mouth as needed for erectile dysfunction.  6  . tiZANidine (ZANAFLEX) 2 MG tablet Take 2 mg by mouth every 8 (eight) hours as needed for muscle spasms.     Marland Kitchen topiramate (TOPAMAX) 25 MG tablet Take 25 mg by mouth 2 (two) times daily.    . traMADol (ULTRAM) 50 MG tablet Take 50-100 mg by mouth every 6 (six) hours as needed for moderate pain (for breakthrough pain).     . triazolam (HALCION) 0.25 MG tablet Take 0.5 mg by mouth at bedtime.      No current facility-administered medications for this visit.    Allergies:  Patient has no known allergies.   Social History: The patient  reports that he has been smoking cigarettes. He has a 40.00 pack-year smoking history. He has never used smokeless tobacco. He reports current alcohol use. He reports that he does not use drugs.   Family History: The patient's family history includes Asthma in his mother; CAD in his father.   ROS:  Please see the history of present illness. Otherwise,  complete review of systems is positive for {NONE DEFAULTED:18576::"none"}.  All other systems are reviewed and negative.   Physical Exam: VS:  There were no vitals taken for this visit., BMI There is no height or weight on file to calculate BMI.  Wt Readings from Last 3 Encounters:  02/01/18 210 lb (95.3 kg)  12/07/17 207 lb (93.9 kg)  05/04/17 207 lb 9.6 oz (94.2 kg)    General: Patient appears comfortable at rest. HEENT: Conjunctiva and lids  normal, oropharynx clear with moist mucosa. Neck: Supple, no elevated JVP or carotid bruits, no thyromegaly. Lungs: Clear to auscultation, nonlabored breathing at rest. Cardiac: Regular rate and rhythm, no S3 or significant systolic murmur, no pericardial rub. Abdomen: Soft, nontender, no hepatomegaly, bowel sounds present, no guarding or rebound. Extremities: No pitting edema, distal pulses 2+. Skin: Warm and dry. Musculoskeletal: No kyphosis. Neuropsychiatric: Alert and oriented x3, affect grossly appropriate.  ECG: I personally reviewed the tracing from 02/01/2018 which showed sinus rhythm with LVH and repolarization abnormalities, left atrial enlargement, and PVC.  Recent Labwork: 05/11/2017: ALT 26; AST 19 02/01/2018: BUN 11; Creatinine, Ser 0.85; Hemoglobin 13.7; Platelets 264; Potassium 4.1; Sodium 138     Component Value Date/Time   CHOL 103 03/29/2014 1255   TRIG 183 (H) 03/29/2014 1255   HDL 37 (L) 03/29/2014 1255   CHOLHDL 2.8 03/29/2014 1255   VLDL 37 03/29/2014 1255   LDLCALC 29 03/29/2014 1255    Other Studies Reviewed Today:  Lexiscan Myoview 04/02/2014: IMPRESSION: 1. No reversible ischemia or infarction.  2. Normal left ventricular wall motion.  3. Left ventricular ejection fraction 58%  4. Low-risk stress test findings*.  Assessment and Plan:    Current medicines were reviewed with the patient today.  No orders of the defined types were placed in this encounter.   Disposition:  Signed, Satira Sark, MD, [facility] 05/02/2018 8:37 [address] [place], [place], [place] [address] Phone: [phone]; Fax: [phone]

## 2018-05-19 ENCOUNTER — Ambulatory Visit (INDEPENDENT_AMBULATORY_CARE_PROVIDER_SITE_OTHER): Payer: Medicare Other | Admitting: Cardiology

## 2018-05-19 ENCOUNTER — Telehealth: Payer: Self-pay | Admitting: Cardiology

## 2018-05-19 ENCOUNTER — Encounter: Payer: Self-pay | Admitting: Cardiology

## 2018-05-19 VITALS — BP 116/88 | HR 90 | Ht 71.0 in | Wt 202.8 lb

## 2018-05-19 DIAGNOSIS — Q231 Congenital insufficiency of aortic valve: Secondary | ICD-10-CM

## 2018-05-19 DIAGNOSIS — Z720 Tobacco use: Secondary | ICD-10-CM

## 2018-05-19 DIAGNOSIS — R9431 Abnormal electrocardiogram [ECG] [EKG]: Secondary | ICD-10-CM | POA: Diagnosis not present

## 2018-05-19 DIAGNOSIS — I10 Essential (primary) hypertension: Secondary | ICD-10-CM

## 2018-05-19 NOTE — Progress Notes (Signed)
Cardiology Office Note  Date: 05/19/2018   ID: Garrett Mitchell, DOB 1960-10-10, MRN [id number]  PCP: Monico Blitz, MD  Consulting Cardiologist: Rozann Lesches, MD   Chief Complaint  Patient presents with  . Abnormal ECG    History of Present Illness: Garrett Mitchell is a 58 y.o. male referred for cardiology consultation by Dr. Oneida Alar due to abnormal screening ECG done in preparation for elective endoscopy (already completed in October 2019).  He has canceled 3 office consultations so far, but presented today with his wife for evaluation.  Tracing in question was from October 2019 as outlined below.  I reviewed his previous tracing from 2017 as well.  He has had LVH with repolarization abnormalities fairly consistently.   He reportedly had an echocardiogram done back in 2015 although I cannot access the formal report.  Based on a rounding note by Dr. Harl Bowie from that time the patient had moderate to severe LVH with LVEF 70 to 75%, no wall motion abnormalities, grade 2 diastolic dysfunction, SAM in the setting of LVH with mild subvalvular gradient, uncertain number of aortic valve cusps but no stenosis.   The patient tells me that he was diagnosed with a bicuspid aortic valve at [name] at age 62.  Follow-up from there is not clear.  He does not report any exertional chest pain, no palpitations or syncope.  He states that he follows regularly with PCP and has been compliant with his medications which are listed below.   Past Medical History:  Diagnosis Date  . Anxiety   . Aortic valve disease    Presumably bicuspid - limited information  . Arthritis   . Chronic back pain   . COPD (chronic obstructive pulmonary disease) ([place])   . Dental caries   . Essential hypertension   . Headache   . Hepatitis C antibody positive in blood 2011   HEP C RNA NEG JAN 2019  . History of stroke   . Hyperlipemia   . Insomnia   . Intussusception of colon ([place])    Age 31  . Restless leg syndrome   .  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 Type 2 diabetes mellitus ([place])     Past Surgical History:  Procedure Laterality Date  . BACK SURGERY     lumbar surgery  . BIOPSY  02/07/2018   Procedure: BIOPSY;  Surgeon: Danie Binder, MD;  [facility];  Service: Endoscopy;;  gastric bx's  . BOWEL RESECTION     AGE 76  . CHOLECYSTECTOMY    . COLONOSCOPY WITH PROPOFOL N/A 02/07/2018   Procedure: COLONOSCOPY WITH PROPOFOL;  Surgeon: Danie Binder, MD;  [facility];  Service: Endoscopy;  Laterality: N/A;  2:00pm  . ESOPHAGOGASTRODUODENOSCOPY (EGD) WITH PROPOFOL N/A 02/07/2018   Procedure: ESOPHAGOGASTRODUODENOSCOPY (EGD) WITH PROPOFOL;  Surgeon: Danie Binder, MD;  [facility];  Service: Endoscopy;  Laterality: N/A;  . INTUSSUSCEPTION REPAIR N/A    At age 66 years  . MULTIPLE EXTRACTIONS WITH ALVEOLOPLASTY N/A 06/13/2015   Procedure: MULTIPLE EXTRACTIONS WITH ALVEOLOPLASTY;  Surgeon: Diona Browner, DDS;  Location: [facility];  Service: Oral Surgery;  Laterality: N/A;  . POLYPECTOMY  02/07/2018   Procedure: POLYPECTOMY;  Surgeon: Danie Binder, MD;  [facility];  Service: Endoscopy;;  ascending colon polyp, hepatic flexure polyp, transverse colon polyps x3, descending colon polyp, sigmoid colon polyps x2, rectal polyps x2  . VASECTOMY  Current Outpatient Medications  Medication Sig Dispense Refill  . ALPRAZolam (XANAX) 0.5 MG tablet Take 0.5 mg by mouth 3 (three) times daily.   1  . amLODipine (NORVASC) 10 MG tablet Take 10 mg by mouth every morning.     Marland Kitchen aspirin EC 81 MG EC tablet Take 1 tablet (81 mg total) by mouth daily.    . brimonidine (ALPHAGAN P) 0.1 % SOLN Place 1 drop into both eyes 2 (two) times daily.    . butalbital-aspirin-caffeine (FIORINAL) 50-325-40 MG capsule Take 1 capsule by mouth every 4 (four) hours as needed for headache.   1  . Carboxymethylcellul-Glycerin (LUBRICATING EYE DROPS OP) Place 1 drop into  both eyes daily as needed (dry eyes).    . Cholecalciferol (VITAMIN D) 2000 units tablet Take 2,000 Units by mouth daily.    . cloNIDine (CATAPRES) 0.2 MG tablet Take 0.2 mg by mouth 2 (two) times daily.    . cyclobenzaprine (FLEXERIL) 10 MG tablet Take 10 mg by mouth every 8 (eight) hours as needed for muscle spasms.  1  . docusate sodium (COLACE) 100 MG capsule Take 100 mg by mouth daily.    . metoprolol tartrate (LOPRESSOR) 25 MG tablet Take 12.5 mg by mouth 2 (two) times daily.    Marland Kitchen morphine (MSIR) 30 MG tablet Take 30 mg by mouth 3 (three) times daily as needed for severe pain.     . Multiple Vitamin (MULTIVITAMIN WITH MINERALS) TABS tablet Take 1 tablet by mouth daily.    Marland Kitchen NARCAN 4 MG/0.1ML LIQD nasal spray kit Place 1 spray into the nose as needed (opiod overdose).   99  . Potassium 99 MG TABS Take 99 mg by mouth daily.    Marland Kitchen PROAIR HFA 108 (90 Base) MCG/ACT inhaler Inhale 2 puffs into the lungs every 4 (four) hours as needed for wheezing or shortness of breath.   5  . rOPINIRole (REQUIP) 0.25 MG tablet Take 0.5 mg by mouth at bedtime.     . sildenafil (REVATIO) 20 MG tablet Take 100 mg by mouth as needed for erectile dysfunction.  6  . tiZANidine (ZANAFLEX) 2 MG tablet Take 2 mg by mouth every 8 (eight) hours as needed for muscle spasms.     Marland Kitchen topiramate (TOPAMAX) 25 MG tablet Take 25 mg by mouth 2 (two) times daily.    . traMADol (ULTRAM) 50 MG tablet Take 50-100 mg by mouth every 6 (six) hours as needed for moderate pain (for breakthrough pain).     . triazolam (HALCION) 0.25 MG tablet Take 0.5 mg by mouth at bedtime.      No current facility-administered medications for this visit.    Allergies:  Patient has no known allergies.   Social History: The patient  reports that he has been smoking cigarettes. He has a 40.00 pack-year smoking history. He has never used smokeless tobacco. He reports current alcohol use. He reports that he does not use drugs.   Family History: The patient's  family history includes Asthma in his mother; CAD in his father.   ROS:  Please see the history of present illness. Otherwise, complete review of systems is positive for none.  All other systems are reviewed and negative.   Physical Exam: VS:  BP 116/88   Pulse 90   Ht '5\' 11"'  (1.803 m)   Wt 202 lb 12.8 oz (92 kg)   SpO2 98%   BMI 28.28 kg/m , BMI Body mass index is 28.28 kg/m.  Wt  Readings from Last 3 Encounters:  05/19/18 202 lb 12.8 oz (92 kg)  02/01/18 210 lb (95.3 kg)  12/07/17 207 lb (93.9 kg)    General: Chronically ill-appearing male, no distress. HEENT: Conjunctiva and lids normal, oropharynx clear poor dentition. Neck: Supple, no elevated JVP or carotid bruits, no thyromegaly. Lungs: Clear to auscultation, nonlabored breathing at rest. Cardiac: Regular rate and rhythm, no S3, soft systolic murmur, no pericardial rub. Abdomen: Soft, nontender, bowel sounds present, no guarding or rebound. Extremities: No pitting edema, distal pulses 2+. Skin: Warm and dry. Musculoskeletal: No kyphosis. Neuropsychiatric: Alert and oriented x3, affect grossly appropriate.  ECG: I personally reviewed the tracing from 02/01/2018 which showed sinus rhythm with LVH and repolarization abnormalities, left atrial enlargement, and PVC.  Recent Labwork: 02/01/2018: BUN 11; Creatinine, Ser 0.85; Hemoglobin 13.7; Platelets 264; Potassium 4.1; Sodium 138     Component Value Date/Time   CHOL 103 03/29/2014 1255   TRIG 183 (H) 03/29/2014 1255   HDL 37 (L) 03/29/2014 1255   CHOLHDL 2.8 03/29/2014 1255   VLDL 37 03/29/2014 1255   LDLCALC 29 03/29/2014 1255    Other Studies Reviewed Today:  Lexiscan Myoview 04/02/2014: IMPRESSION: 1. No reversible ischemia or infarction.  2. Normal left ventricular wall motion.  3. Left ventricular ejection fraction 58%  4. Low-risk stress test findings*.  Assessment and Plan:  1.  Abnormal ECG most consistent with LVH and repolarization changes.  I do  not have his last echocardiogram report from 2015, but apparently at that point he did have moderate to severe LVH as well as SAM with mild subvalvular gradient.  It is not clear that he is symptomatic however.  Follow-up echocardiogram will be obtained.  2.  Reported bicuspid aortic valve, information is incomplete.  Hopefully, a repeat echocardiogram will be clarifying.  He does not have a very significant heart murmur.  3.  Essential hypertension, blood pressure is well controlled today.  He is currently on Norvasc, clonidine, and Lopressor with follow-up per Dr. Manuella Ghazi.  4.  Longstanding tobacco abuse.  He has not been motivated to quit recently.  Current medicines were reviewed with the patient today.   Orders Placed This Encounter  Procedures  . ECHOCARDIOGRAM COMPLETE    Disposition: Call with test results and determine follow-up.  Signed, Satira Sark, MD, [facility] 05/19/2018 3:37 PM    [facility] [place], [place], [place] [address] Phone: [phone]; Fax: [phone]

## 2018-05-19 NOTE — Patient Instructions (Addendum)
Medication Instructions:   Your physician recommends that you continue on your current medications as directed. Please refer to the Current Medication list given to you today.  Labwork:  NONE  Testing/Procedures: Your physician has requested that you have an echocardiogram. Echocardiography is a painless test that uses sound waves to create images of your heart. It provides your doctor with information about the size and shape of your heart and how well your heart's chambers and valves are working. This procedure takes approximately one hour. There are no restrictions for this procedure.  Follow-Up:  Your physician recommends that you schedule a follow-up appointment in: pending test result.  Any Other Special Instructions Will Be Listed Below (If Applicable).  If you need a refill on your cardiac medications before your next appointment, please call your pharmacy. 

## 2018-05-19 NOTE — Telephone Encounter (Signed)
°  Precert needed for: ECHO   Location: [facility]    Date: FEB 13,2020

## 2018-05-24 ENCOUNTER — Other Ambulatory Visit: Payer: Self-pay | Admitting: Cardiology

## 2018-05-24 DIAGNOSIS — I359 Nonrheumatic aortic valve disorder, unspecified: Secondary | ICD-10-CM

## 2018-05-24 DIAGNOSIS — R9431 Abnormal electrocardiogram [ECG] [EKG]: Secondary | ICD-10-CM

## 2018-06-07 ENCOUNTER — Other Ambulatory Visit: Payer: Medicare Other

## 2018-06-08 ENCOUNTER — Other Ambulatory Visit: Payer: Medicare Other

## 2018-06-28 ENCOUNTER — Other Ambulatory Visit: Payer: Medicare Other

## 2018-07-20 ENCOUNTER — Telehealth: Payer: Self-pay | Admitting: Cardiology

## 2018-07-20 NOTE — Telephone Encounter (Signed)
Called patient to try and re-schedule his Echo ordered by Dr. Domenic Polite. Unable to reach patient.

## 2018-11-14 ENCOUNTER — Encounter: Payer: Self-pay | Admitting: Gastroenterology

## 2019-05-31 DIAGNOSIS — M961 Postlaminectomy syndrome, not elsewhere classified: Secondary | ICD-10-CM | POA: Diagnosis not present

## 2019-05-31 DIAGNOSIS — I69322 Dysarthria following cerebral infarction: Secondary | ICD-10-CM | POA: Diagnosis not present

## 2019-05-31 DIAGNOSIS — G5602 Carpal tunnel syndrome, left upper limb: Secondary | ICD-10-CM | POA: Diagnosis not present

## 2019-05-31 DIAGNOSIS — I69398 Other sequelae of cerebral infarction: Secondary | ICD-10-CM | POA: Diagnosis not present

## 2019-06-04 DIAGNOSIS — J449 Chronic obstructive pulmonary disease, unspecified: Secondary | ICD-10-CM | POA: Diagnosis not present

## 2019-06-04 DIAGNOSIS — F17210 Nicotine dependence, cigarettes, uncomplicated: Secondary | ICD-10-CM | POA: Diagnosis not present

## 2019-06-04 DIAGNOSIS — E1165 Type 2 diabetes mellitus with hyperglycemia: Secondary | ICD-10-CM | POA: Diagnosis not present

## 2019-06-04 DIAGNOSIS — Z299 Encounter for prophylactic measures, unspecified: Secondary | ICD-10-CM | POA: Diagnosis not present

## 2019-06-04 DIAGNOSIS — I10 Essential (primary) hypertension: Secondary | ICD-10-CM | POA: Diagnosis not present

## 2019-07-26 DIAGNOSIS — G5602 Carpal tunnel syndrome, left upper limb: Secondary | ICD-10-CM | POA: Diagnosis not present

## 2019-07-26 DIAGNOSIS — Z79899 Other long term (current) drug therapy: Secondary | ICD-10-CM | POA: Diagnosis not present

## 2019-07-26 DIAGNOSIS — M545 Low back pain: Secondary | ICD-10-CM | POA: Diagnosis not present

## 2019-07-26 DIAGNOSIS — G43711 Chronic migraine without aura, intractable, with status migrainosus: Secondary | ICD-10-CM | POA: Diagnosis not present

## 2019-07-26 DIAGNOSIS — G2581 Restless legs syndrome: Secondary | ICD-10-CM | POA: Diagnosis not present

## 2019-07-26 DIAGNOSIS — G4709 Other insomnia: Secondary | ICD-10-CM | POA: Diagnosis not present

## 2019-09-20 DIAGNOSIS — G43711 Chronic migraine without aura, intractable, with status migrainosus: Secondary | ICD-10-CM | POA: Diagnosis not present

## 2019-09-20 DIAGNOSIS — G4709 Other insomnia: Secondary | ICD-10-CM | POA: Diagnosis not present

## 2019-09-20 DIAGNOSIS — Z79891 Long term (current) use of opiate analgesic: Secondary | ICD-10-CM | POA: Diagnosis not present

## 2019-09-20 DIAGNOSIS — G5602 Carpal tunnel syndrome, left upper limb: Secondary | ICD-10-CM | POA: Diagnosis not present

## 2019-09-20 DIAGNOSIS — M545 Low back pain: Secondary | ICD-10-CM | POA: Diagnosis not present

## 2019-10-11 DIAGNOSIS — Z713 Dietary counseling and surveillance: Secondary | ICD-10-CM | POA: Diagnosis not present

## 2019-10-11 DIAGNOSIS — I10 Essential (primary) hypertension: Secondary | ICD-10-CM | POA: Diagnosis not present

## 2019-10-11 DIAGNOSIS — J449 Chronic obstructive pulmonary disease, unspecified: Secondary | ICD-10-CM | POA: Diagnosis not present

## 2019-10-11 DIAGNOSIS — Z299 Encounter for prophylactic measures, unspecified: Secondary | ICD-10-CM | POA: Diagnosis not present

## 2019-10-11 DIAGNOSIS — E1165 Type 2 diabetes mellitus with hyperglycemia: Secondary | ICD-10-CM | POA: Diagnosis not present

## 2019-11-22 DIAGNOSIS — Z1211 Encounter for screening for malignant neoplasm of colon: Secondary | ICD-10-CM | POA: Diagnosis not present

## 2019-11-22 DIAGNOSIS — I10 Essential (primary) hypertension: Secondary | ICD-10-CM | POA: Diagnosis not present

## 2019-11-22 DIAGNOSIS — Z299 Encounter for prophylactic measures, unspecified: Secondary | ICD-10-CM | POA: Diagnosis not present

## 2019-11-22 DIAGNOSIS — Z79899 Other long term (current) drug therapy: Secondary | ICD-10-CM | POA: Diagnosis not present

## 2019-11-22 DIAGNOSIS — Z7189 Other specified counseling: Secondary | ICD-10-CM | POA: Diagnosis not present

## 2019-11-22 DIAGNOSIS — R5383 Other fatigue: Secondary | ICD-10-CM | POA: Diagnosis not present

## 2019-11-22 DIAGNOSIS — E7800 Pure hypercholesterolemia, unspecified: Secondary | ICD-10-CM | POA: Diagnosis not present

## 2019-11-22 DIAGNOSIS — Z0000 Encounter for general adult medical examination without abnormal findings: Secondary | ICD-10-CM | POA: Diagnosis not present

## 2019-12-13 DIAGNOSIS — G4709 Other insomnia: Secondary | ICD-10-CM | POA: Diagnosis not present

## 2019-12-13 DIAGNOSIS — G2581 Restless legs syndrome: Secondary | ICD-10-CM | POA: Diagnosis not present

## 2019-12-13 DIAGNOSIS — M545 Low back pain: Secondary | ICD-10-CM | POA: Diagnosis not present

## 2019-12-13 DIAGNOSIS — G43711 Chronic migraine without aura, intractable, with status migrainosus: Secondary | ICD-10-CM | POA: Diagnosis not present

## 2019-12-13 DIAGNOSIS — Z79891 Long term (current) use of opiate analgesic: Secondary | ICD-10-CM | POA: Diagnosis not present

## 2020-02-12 DIAGNOSIS — M545 Low back pain, unspecified: Secondary | ICD-10-CM | POA: Diagnosis not present

## 2020-02-12 DIAGNOSIS — Z79891 Long term (current) use of opiate analgesic: Secondary | ICD-10-CM | POA: Diagnosis not present

## 2020-02-12 DIAGNOSIS — G4709 Other insomnia: Secondary | ICD-10-CM | POA: Diagnosis not present

## 2020-02-12 DIAGNOSIS — G43711 Chronic migraine without aura, intractable, with status migrainosus: Secondary | ICD-10-CM | POA: Diagnosis not present

## 2020-02-12 DIAGNOSIS — G5602 Carpal tunnel syndrome, left upper limb: Secondary | ICD-10-CM | POA: Diagnosis not present

## 2020-02-18 DIAGNOSIS — I10 Essential (primary) hypertension: Secondary | ICD-10-CM | POA: Diagnosis not present

## 2020-02-18 DIAGNOSIS — J449 Chronic obstructive pulmonary disease, unspecified: Secondary | ICD-10-CM | POA: Diagnosis not present

## 2020-02-18 DIAGNOSIS — Z299 Encounter for prophylactic measures, unspecified: Secondary | ICD-10-CM | POA: Diagnosis not present

## 2020-02-18 DIAGNOSIS — E1165 Type 2 diabetes mellitus with hyperglycemia: Secondary | ICD-10-CM | POA: Diagnosis not present

## 2020-02-18 DIAGNOSIS — F17210 Nicotine dependence, cigarettes, uncomplicated: Secondary | ICD-10-CM | POA: Diagnosis not present

## 2020-04-16 DIAGNOSIS — B349 Viral infection, unspecified: Secondary | ICD-10-CM | POA: Diagnosis not present

## 2020-04-16 DIAGNOSIS — R918 Other nonspecific abnormal finding of lung field: Secondary | ICD-10-CM | POA: Diagnosis not present

## 2020-04-16 DIAGNOSIS — Z9049 Acquired absence of other specified parts of digestive tract: Secondary | ICD-10-CM | POA: Diagnosis not present

## 2020-04-16 DIAGNOSIS — R059 Cough, unspecified: Secondary | ICD-10-CM | POA: Diagnosis not present

## 2020-04-16 DIAGNOSIS — Z743 Need for continuous supervision: Secondary | ICD-10-CM | POA: Diagnosis not present

## 2020-04-16 DIAGNOSIS — R7989 Other specified abnormal findings of blood chemistry: Secondary | ICD-10-CM | POA: Diagnosis not present

## 2020-04-16 DIAGNOSIS — R9431 Abnormal electrocardiogram [ECG] [EKG]: Secondary | ICD-10-CM | POA: Diagnosis not present

## 2020-04-16 DIAGNOSIS — J449 Chronic obstructive pulmonary disease, unspecified: Secondary | ICD-10-CM | POA: Diagnosis not present

## 2020-04-16 DIAGNOSIS — R0689 Other abnormalities of breathing: Secondary | ICD-10-CM | POA: Diagnosis not present

## 2020-04-16 DIAGNOSIS — E785 Hyperlipidemia, unspecified: Secondary | ICD-10-CM | POA: Diagnosis not present

## 2020-04-16 DIAGNOSIS — R0603 Acute respiratory distress: Secondary | ICD-10-CM | POA: Diagnosis not present

## 2020-04-16 DIAGNOSIS — R062 Wheezing: Secondary | ICD-10-CM | POA: Diagnosis not present

## 2020-04-16 DIAGNOSIS — R0602 Shortness of breath: Secondary | ICD-10-CM | POA: Diagnosis not present

## 2020-04-16 DIAGNOSIS — Z79899 Other long term (current) drug therapy: Secondary | ICD-10-CM | POA: Diagnosis not present

## 2020-04-16 DIAGNOSIS — I10 Essential (primary) hypertension: Secondary | ICD-10-CM | POA: Diagnosis not present

## 2020-04-16 DIAGNOSIS — Z23 Encounter for immunization: Secondary | ICD-10-CM | POA: Diagnosis not present

## 2020-04-16 DIAGNOSIS — R0902 Hypoxemia: Secondary | ICD-10-CM | POA: Diagnosis not present

## 2020-04-16 DIAGNOSIS — R079 Chest pain, unspecified: Secondary | ICD-10-CM | POA: Diagnosis not present

## 2020-04-16 DIAGNOSIS — F17200 Nicotine dependence, unspecified, uncomplicated: Secondary | ICD-10-CM | POA: Diagnosis not present

## 2020-04-16 DIAGNOSIS — J984 Other disorders of lung: Secondary | ICD-10-CM | POA: Diagnosis not present

## 2020-04-16 DIAGNOSIS — M549 Dorsalgia, unspecified: Secondary | ICD-10-CM | POA: Diagnosis not present

## 2020-04-16 DIAGNOSIS — G8929 Other chronic pain: Secondary | ICD-10-CM | POA: Diagnosis not present

## 2020-04-16 DIAGNOSIS — Z7982 Long term (current) use of aspirin: Secondary | ICD-10-CM | POA: Diagnosis not present

## 2020-04-16 DIAGNOSIS — R778 Other specified abnormalities of plasma proteins: Secondary | ICD-10-CM | POA: Diagnosis not present

## 2020-04-16 DIAGNOSIS — R911 Solitary pulmonary nodule: Secondary | ICD-10-CM | POA: Diagnosis not present

## 2020-04-17 DIAGNOSIS — R778 Other specified abnormalities of plasma proteins: Secondary | ICD-10-CM | POA: Diagnosis not present

## 2020-04-17 DIAGNOSIS — R7989 Other specified abnormal findings of blood chemistry: Secondary | ICD-10-CM | POA: Diagnosis not present

## 2020-04-17 DIAGNOSIS — R0603 Acute respiratory distress: Secondary | ICD-10-CM | POA: Diagnosis not present

## 2020-04-18 DIAGNOSIS — I509 Heart failure, unspecified: Secondary | ICD-10-CM | POA: Diagnosis not present

## 2020-05-05 DIAGNOSIS — E1165 Type 2 diabetes mellitus with hyperglycemia: Secondary | ICD-10-CM | POA: Diagnosis not present

## 2020-05-05 DIAGNOSIS — F17210 Nicotine dependence, cigarettes, uncomplicated: Secondary | ICD-10-CM | POA: Diagnosis not present

## 2020-05-05 DIAGNOSIS — J449 Chronic obstructive pulmonary disease, unspecified: Secondary | ICD-10-CM | POA: Diagnosis not present

## 2020-05-05 DIAGNOSIS — I639 Cerebral infarction, unspecified: Secondary | ICD-10-CM | POA: Diagnosis not present

## 2020-05-05 DIAGNOSIS — Z299 Encounter for prophylactic measures, unspecified: Secondary | ICD-10-CM | POA: Diagnosis not present

## 2020-05-06 DIAGNOSIS — M5416 Radiculopathy, lumbar region: Secondary | ICD-10-CM | POA: Diagnosis not present

## 2020-05-06 DIAGNOSIS — G43711 Chronic migraine without aura, intractable, with status migrainosus: Secondary | ICD-10-CM | POA: Diagnosis not present

## 2020-05-06 DIAGNOSIS — R471 Dysarthria and anarthria: Secondary | ICD-10-CM | POA: Diagnosis not present

## 2020-05-06 DIAGNOSIS — G4709 Other insomnia: Secondary | ICD-10-CM | POA: Diagnosis not present

## 2020-05-06 DIAGNOSIS — Z79891 Long term (current) use of opiate analgesic: Secondary | ICD-10-CM | POA: Diagnosis not present

## 2020-05-06 DIAGNOSIS — M545 Low back pain, unspecified: Secondary | ICD-10-CM | POA: Diagnosis not present

## 2020-05-06 DIAGNOSIS — M961 Postlaminectomy syndrome, not elsewhere classified: Secondary | ICD-10-CM | POA: Diagnosis not present

## 2020-05-06 DIAGNOSIS — E559 Vitamin D deficiency, unspecified: Secondary | ICD-10-CM | POA: Diagnosis not present

## 2020-05-06 DIAGNOSIS — I10 Essential (primary) hypertension: Secondary | ICD-10-CM | POA: Diagnosis not present

## 2020-05-06 DIAGNOSIS — I6930 Unspecified sequelae of cerebral infarction: Secondary | ICD-10-CM | POA: Diagnosis not present

## 2020-05-06 DIAGNOSIS — G5602 Carpal tunnel syndrome, left upper limb: Secondary | ICD-10-CM | POA: Diagnosis not present

## 2020-05-16 DIAGNOSIS — J449 Chronic obstructive pulmonary disease, unspecified: Secondary | ICD-10-CM | POA: Diagnosis not present

## 2020-05-16 DIAGNOSIS — J441 Chronic obstructive pulmonary disease with (acute) exacerbation: Secondary | ICD-10-CM | POA: Diagnosis not present

## 2020-05-16 DIAGNOSIS — Z299 Encounter for prophylactic measures, unspecified: Secondary | ICD-10-CM | POA: Diagnosis not present

## 2020-05-16 DIAGNOSIS — F17210 Nicotine dependence, cigarettes, uncomplicated: Secondary | ICD-10-CM | POA: Diagnosis not present

## 2020-06-25 DIAGNOSIS — Z299 Encounter for prophylactic measures, unspecified: Secondary | ICD-10-CM | POA: Diagnosis not present

## 2020-06-25 DIAGNOSIS — E1165 Type 2 diabetes mellitus with hyperglycemia: Secondary | ICD-10-CM | POA: Diagnosis not present

## 2020-06-25 DIAGNOSIS — Z0000 Encounter for general adult medical examination without abnormal findings: Secondary | ICD-10-CM | POA: Diagnosis not present

## 2020-06-25 DIAGNOSIS — I10 Essential (primary) hypertension: Secondary | ICD-10-CM | POA: Diagnosis not present

## 2020-07-04 DIAGNOSIS — I10 Essential (primary) hypertension: Secondary | ICD-10-CM | POA: Diagnosis not present

## 2020-07-04 DIAGNOSIS — I6930 Unspecified sequelae of cerebral infarction: Secondary | ICD-10-CM | POA: Diagnosis not present

## 2020-07-04 DIAGNOSIS — G2581 Restless legs syndrome: Secondary | ICD-10-CM | POA: Diagnosis not present

## 2020-07-04 DIAGNOSIS — Z79891 Long term (current) use of opiate analgesic: Secondary | ICD-10-CM | POA: Diagnosis not present

## 2020-07-04 DIAGNOSIS — M961 Postlaminectomy syndrome, not elsewhere classified: Secondary | ICD-10-CM | POA: Diagnosis not present

## 2020-07-04 DIAGNOSIS — G43711 Chronic migraine without aura, intractable, with status migrainosus: Secondary | ICD-10-CM | POA: Diagnosis not present

## 2020-07-04 DIAGNOSIS — E559 Vitamin D deficiency, unspecified: Secondary | ICD-10-CM | POA: Diagnosis not present

## 2020-07-04 DIAGNOSIS — M5416 Radiculopathy, lumbar region: Secondary | ICD-10-CM | POA: Diagnosis not present

## 2020-07-04 DIAGNOSIS — G5602 Carpal tunnel syndrome, left upper limb: Secondary | ICD-10-CM | POA: Diagnosis not present

## 2020-07-04 DIAGNOSIS — R471 Dysarthria and anarthria: Secondary | ICD-10-CM | POA: Diagnosis not present

## 2020-07-04 DIAGNOSIS — G4709 Other insomnia: Secondary | ICD-10-CM | POA: Diagnosis not present

## 2020-07-04 DIAGNOSIS — M545 Low back pain, unspecified: Secondary | ICD-10-CM | POA: Diagnosis not present

## 2020-08-10 DIAGNOSIS — T887XXA Unspecified adverse effect of drug or medicament, initial encounter: Secondary | ICD-10-CM | POA: Diagnosis not present

## 2020-08-10 DIAGNOSIS — T50904A Poisoning by unspecified drugs, medicaments and biological substances, undetermined, initial encounter: Secondary | ICD-10-CM | POA: Diagnosis not present

## 2020-09-26 DIAGNOSIS — G5602 Carpal tunnel syndrome, left upper limb: Secondary | ICD-10-CM | POA: Diagnosis not present

## 2020-09-26 DIAGNOSIS — E559 Vitamin D deficiency, unspecified: Secondary | ICD-10-CM | POA: Diagnosis not present

## 2020-09-26 DIAGNOSIS — G2581 Restless legs syndrome: Secondary | ICD-10-CM | POA: Diagnosis not present

## 2020-09-26 DIAGNOSIS — I10 Essential (primary) hypertension: Secondary | ICD-10-CM | POA: Diagnosis not present

## 2020-09-26 DIAGNOSIS — G4709 Other insomnia: Secondary | ICD-10-CM | POA: Diagnosis not present

## 2020-09-26 DIAGNOSIS — R471 Dysarthria and anarthria: Secondary | ICD-10-CM | POA: Diagnosis not present

## 2020-09-26 DIAGNOSIS — M961 Postlaminectomy syndrome, not elsewhere classified: Secondary | ICD-10-CM | POA: Diagnosis not present

## 2020-09-26 DIAGNOSIS — G43711 Chronic migraine without aura, intractable, with status migrainosus: Secondary | ICD-10-CM | POA: Diagnosis not present

## 2020-09-26 DIAGNOSIS — I6930 Unspecified sequelae of cerebral infarction: Secondary | ICD-10-CM | POA: Diagnosis not present

## 2020-09-26 DIAGNOSIS — M545 Low back pain, unspecified: Secondary | ICD-10-CM | POA: Diagnosis not present

## 2020-09-26 DIAGNOSIS — M5416 Radiculopathy, lumbar region: Secondary | ICD-10-CM | POA: Diagnosis not present

## 2020-12-25 DIAGNOSIS — Z79891 Long term (current) use of opiate analgesic: Secondary | ICD-10-CM | POA: Diagnosis not present

## 2020-12-25 DIAGNOSIS — E559 Vitamin D deficiency, unspecified: Secondary | ICD-10-CM | POA: Diagnosis not present

## 2020-12-25 DIAGNOSIS — G4709 Other insomnia: Secondary | ICD-10-CM | POA: Diagnosis not present

## 2020-12-25 DIAGNOSIS — G5602 Carpal tunnel syndrome, left upper limb: Secondary | ICD-10-CM | POA: Diagnosis not present

## 2020-12-25 DIAGNOSIS — Z79899 Other long term (current) drug therapy: Secondary | ICD-10-CM | POA: Diagnosis not present

## 2020-12-25 DIAGNOSIS — I6930 Unspecified sequelae of cerebral infarction: Secondary | ICD-10-CM | POA: Diagnosis not present

## 2020-12-25 DIAGNOSIS — M5416 Radiculopathy, lumbar region: Secondary | ICD-10-CM | POA: Diagnosis not present

## 2020-12-25 DIAGNOSIS — G43711 Chronic migraine without aura, intractable, with status migrainosus: Secondary | ICD-10-CM | POA: Diagnosis not present

## 2020-12-25 DIAGNOSIS — I10 Essential (primary) hypertension: Secondary | ICD-10-CM | POA: Diagnosis not present

## 2020-12-25 DIAGNOSIS — M961 Postlaminectomy syndrome, not elsewhere classified: Secondary | ICD-10-CM | POA: Diagnosis not present

## 2020-12-25 DIAGNOSIS — R471 Dysarthria and anarthria: Secondary | ICD-10-CM | POA: Diagnosis not present

## 2020-12-25 DIAGNOSIS — M545 Low back pain, unspecified: Secondary | ICD-10-CM | POA: Diagnosis not present

## 2021-02-18 ENCOUNTER — Encounter: Payer: Self-pay | Admitting: *Deleted

## 2021-02-20 DIAGNOSIS — E559 Vitamin D deficiency, unspecified: Secondary | ICD-10-CM | POA: Diagnosis not present

## 2021-02-20 DIAGNOSIS — M961 Postlaminectomy syndrome, not elsewhere classified: Secondary | ICD-10-CM | POA: Diagnosis not present

## 2021-02-20 DIAGNOSIS — G4709 Other insomnia: Secondary | ICD-10-CM | POA: Diagnosis not present

## 2021-02-20 DIAGNOSIS — G5602 Carpal tunnel syndrome, left upper limb: Secondary | ICD-10-CM | POA: Diagnosis not present

## 2021-02-20 DIAGNOSIS — Z79891 Long term (current) use of opiate analgesic: Secondary | ICD-10-CM | POA: Diagnosis not present

## 2021-02-20 DIAGNOSIS — G43711 Chronic migraine without aura, intractable, with status migrainosus: Secondary | ICD-10-CM | POA: Diagnosis not present

## 2021-02-20 DIAGNOSIS — R471 Dysarthria and anarthria: Secondary | ICD-10-CM | POA: Diagnosis not present

## 2021-02-20 DIAGNOSIS — M545 Low back pain, unspecified: Secondary | ICD-10-CM | POA: Diagnosis not present

## 2021-02-20 DIAGNOSIS — I6930 Unspecified sequelae of cerebral infarction: Secondary | ICD-10-CM | POA: Diagnosis not present

## 2021-02-20 DIAGNOSIS — M5416 Radiculopathy, lumbar region: Secondary | ICD-10-CM | POA: Diagnosis not present

## 2021-02-20 DIAGNOSIS — I10 Essential (primary) hypertension: Secondary | ICD-10-CM | POA: Diagnosis not present

## 2021-02-23 DIAGNOSIS — J449 Chronic obstructive pulmonary disease, unspecified: Secondary | ICD-10-CM | POA: Diagnosis not present

## 2021-02-23 DIAGNOSIS — I10 Essential (primary) hypertension: Secondary | ICD-10-CM | POA: Diagnosis not present

## 2021-02-23 DIAGNOSIS — R011 Cardiac murmur, unspecified: Secondary | ICD-10-CM | POA: Diagnosis not present

## 2021-02-27 ENCOUNTER — Other Ambulatory Visit: Payer: Self-pay | Admitting: Neurology

## 2021-02-27 ENCOUNTER — Other Ambulatory Visit (HOSPITAL_COMMUNITY): Payer: Self-pay | Admitting: Neurology

## 2021-02-27 DIAGNOSIS — G8929 Other chronic pain: Secondary | ICD-10-CM

## 2021-02-27 DIAGNOSIS — M545 Low back pain, unspecified: Secondary | ICD-10-CM

## 2021-03-13 ENCOUNTER — Other Ambulatory Visit: Payer: Self-pay

## 2021-03-13 ENCOUNTER — Ambulatory Visit (HOSPITAL_COMMUNITY)
Admission: RE | Admit: 2021-03-13 | Discharge: 2021-03-13 | Disposition: A | Discharge status: Home / Self Care | Payer: Medicare Other | Source: Ambulatory Visit | Attending: Neurology | Admitting: Neurology

## 2021-03-13 DIAGNOSIS — G8929 Other chronic pain: Secondary | ICD-10-CM | POA: Diagnosis not present

## 2021-03-13 DIAGNOSIS — M545 Low back pain, unspecified: Secondary | ICD-10-CM | POA: Diagnosis not present

## 2021-03-31 DIAGNOSIS — G43711 Chronic migraine without aura, intractable, with status migrainosus: Secondary | ICD-10-CM | POA: Diagnosis not present

## 2021-03-31 DIAGNOSIS — M961 Postlaminectomy syndrome, not elsewhere classified: Secondary | ICD-10-CM | POA: Diagnosis not present

## 2021-03-31 DIAGNOSIS — G5602 Carpal tunnel syndrome, left upper limb: Secondary | ICD-10-CM | POA: Diagnosis not present

## 2021-03-31 DIAGNOSIS — G4709 Other insomnia: Secondary | ICD-10-CM | POA: Diagnosis not present

## 2021-03-31 DIAGNOSIS — R471 Dysarthria and anarthria: Secondary | ICD-10-CM | POA: Diagnosis not present

## 2021-03-31 DIAGNOSIS — M5416 Radiculopathy, lumbar region: Secondary | ICD-10-CM | POA: Diagnosis not present

## 2021-03-31 DIAGNOSIS — Z79891 Long term (current) use of opiate analgesic: Secondary | ICD-10-CM | POA: Diagnosis not present

## 2021-03-31 DIAGNOSIS — I10 Essential (primary) hypertension: Secondary | ICD-10-CM | POA: Diagnosis not present

## 2021-03-31 DIAGNOSIS — M545 Low back pain, unspecified: Secondary | ICD-10-CM | POA: Diagnosis not present

## 2021-03-31 DIAGNOSIS — E559 Vitamin D deficiency, unspecified: Secondary | ICD-10-CM | POA: Diagnosis not present

## 2021-03-31 DIAGNOSIS — I6930 Unspecified sequelae of cerebral infarction: Secondary | ICD-10-CM | POA: Diagnosis not present

## 2021-06-24 DEATH — deceased

## 2022-06-28 IMAGING — MR MR LUMBAR SPINE W/O CM
4 of 5 series · 30 of 48 positions shown · non-contrast
Comparison: Intraoperative lumbar spine radiograph 06/07/2000.

Report of prior lumbar spine CT 02/13/2001 (no images available).

CLINICAL DATA: 60-year-old male status post fall 6 months ago. Low
back pain. Prior surgery.

EXAM:
MRI LUMBAR SPINE WITHOUT CONTRAST
TECHNIQUE: Multiplanar, multisequence MR imaging of the lumbar spine was
performed. No intravenous contrast was administered.

[Series 5: T2 · sagittal · 4.0mm · 0.68mm/px · 6 of 15 slices shown (1 of 2)]
[im 1/15]
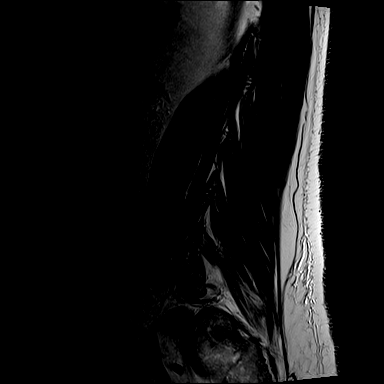
[im 3/15]
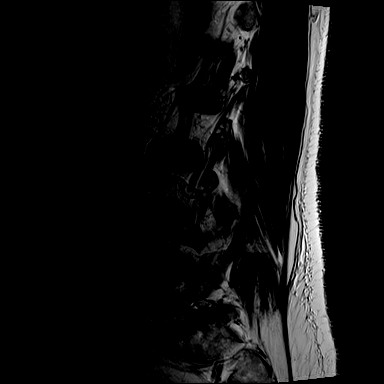
[im 6/15]
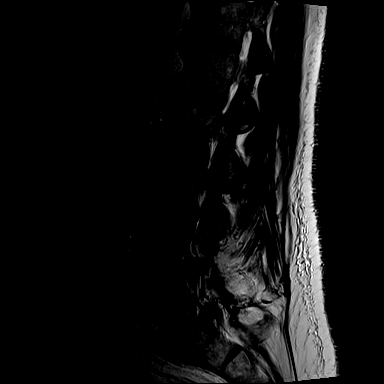
[im 9/15]
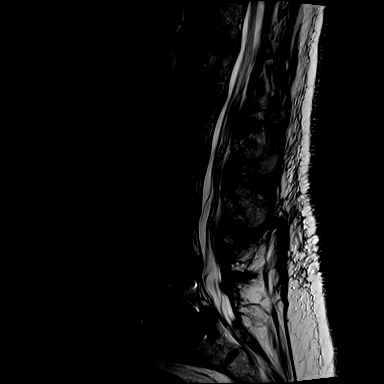
[im 12/15]
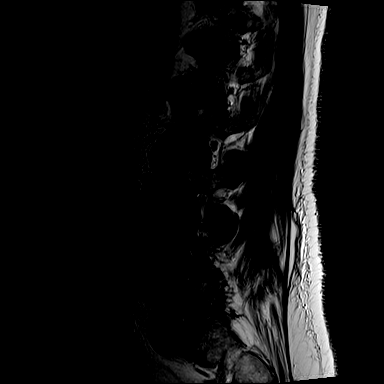
[im 15/15]
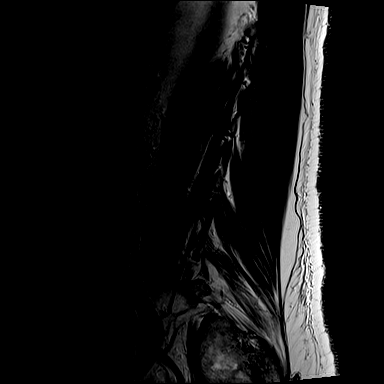

[Series 6: T1 · sagittal · 4.0mm · 0.81mm/px · 6 of 15 slices shown (1 of 2)]
[im 1/15]
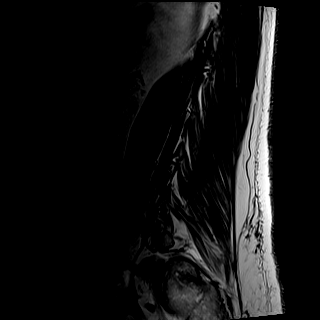
[im 3/15]
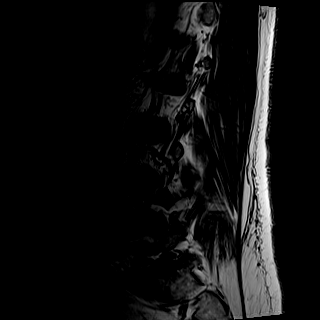
[im 6/15]
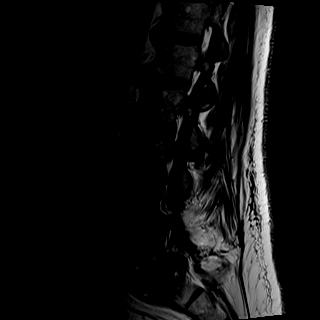
[im 9/15]
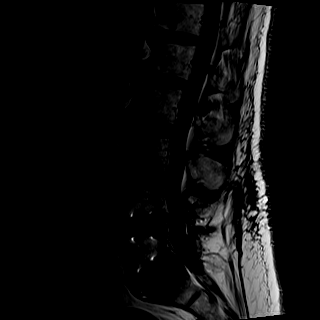
[im 12/15]
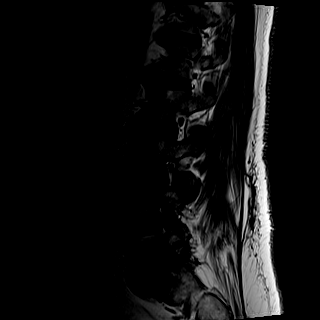
[im 15/15]
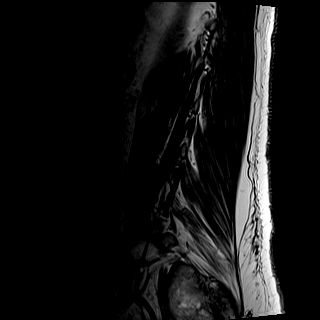

[Series 8: T2 · axial · 4.0mm · 0.70mm/px · z∈[-196,+19]mm · 9 of 38 slices shown (2 of 2)]
[im 1/38]
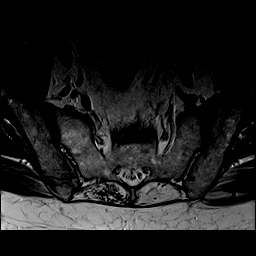
[im 6/38]
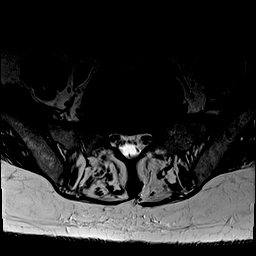
[im 11/38]
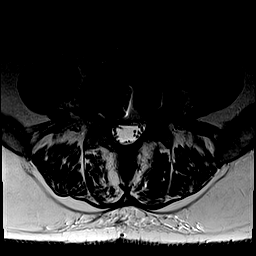
[im 16/38]
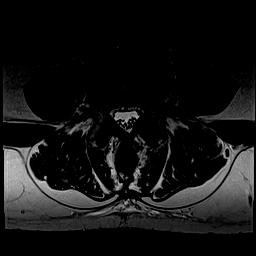
[im 19/38]
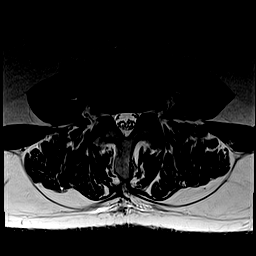
[im 22/38]
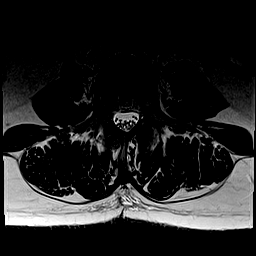
[im 27/38]
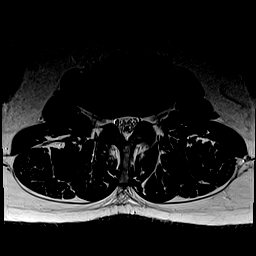
[im 32/38]
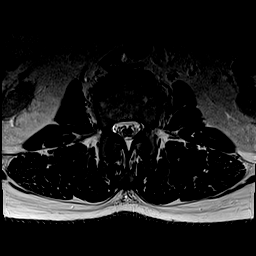
[im 38/38]
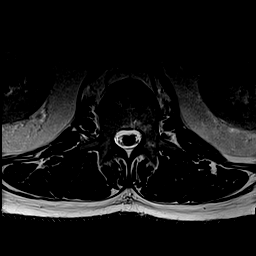

[Series 9: T1 · axial · 4.0mm · 0.35mm/px · z∈[-196,+19]mm · 9 of 38 slices shown (2 of 2)]
[im 1/38]
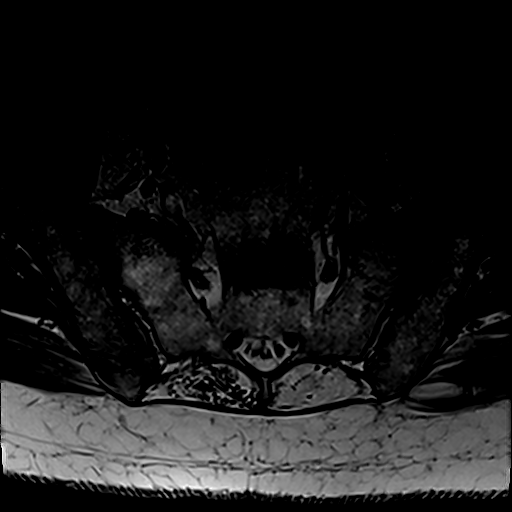
[im 6/38]
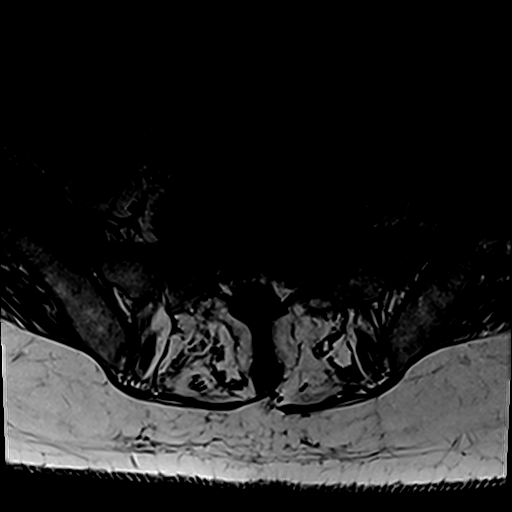
[im 11/38]
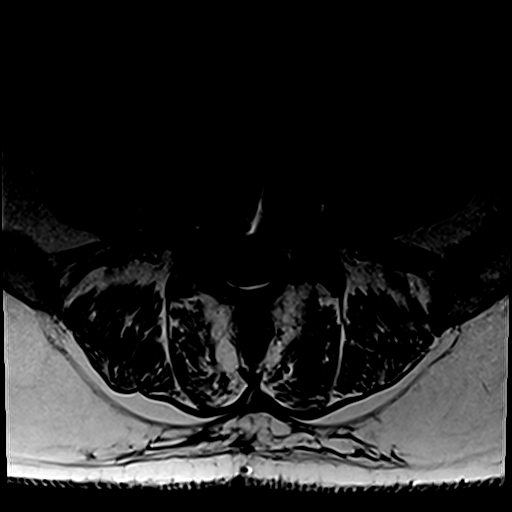
[im 16/38]
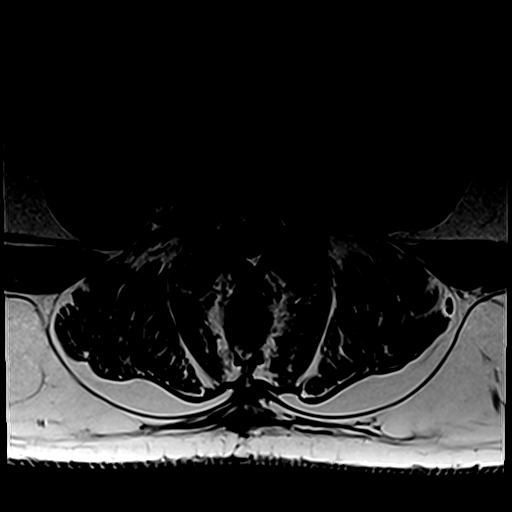
[im 19/38]
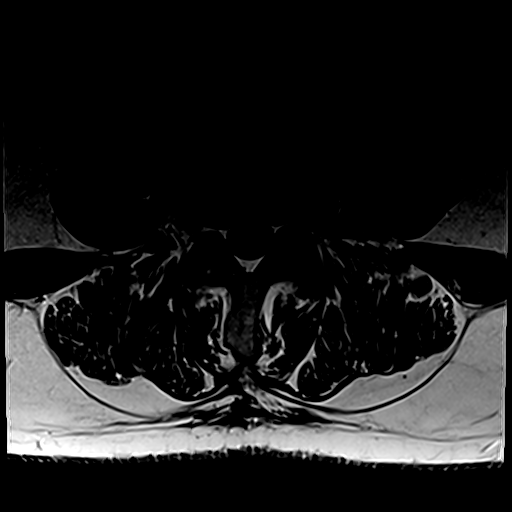
[im 22/38]
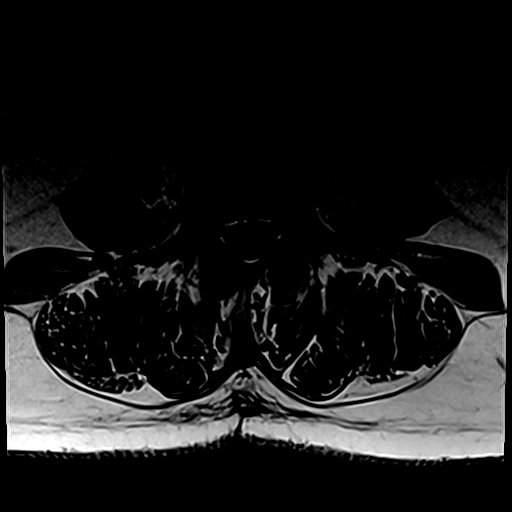
[im 27/38]
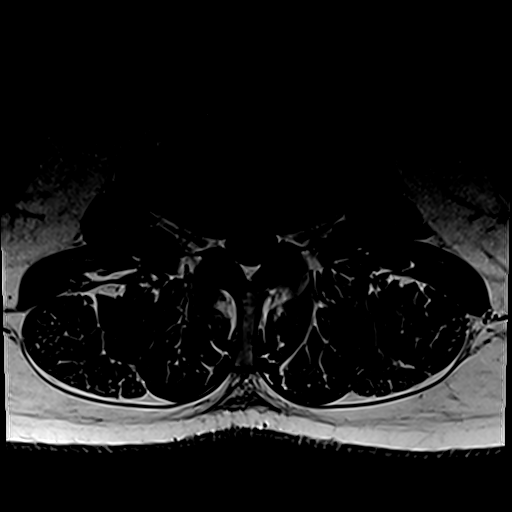
[im 32/38]
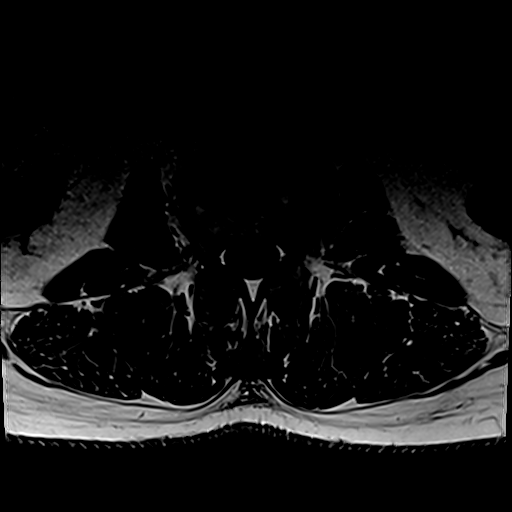
[im 38/38]
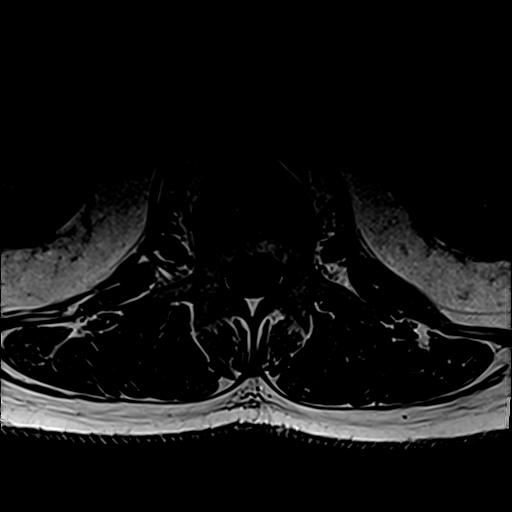

[30 of 48 positions shown; findings below may reference images not displayed]

FINDINGS: Segmentation: Lumbar segmentation appears to be normal and will be
designated as such for this report.

Alignment: Preserved lumbar lordosis. Subtle anterolisthesis of L5
on S1.

Vertebrae: Mild hardware susceptibility artifact related to chronic
L4-L5 and L5-S1 interbody implants. But there does appear to be mild
degenerative marrow edema in the central S1 body (series 7, image
8). Although the visible sacrum and SI joints appear intact. Also
faint degenerative appearing marrow edema of the left lateral L4
superior endplate. See L3-L4 disc details below. Normal background
bone marrow signal. No other marrow edema or acute osseous
abnormality.

Conus medullaris and cauda equina: Conus extends to the L2 level. No
lower spinal cord or conus signal abnormality.

Paraspinal and other soft tissues: Negative visible abdominal
viscera. Mild postoperative changes to the lower lumbar paraspinal
soft tissues.

Disc levels:

T12-L1:  Negative.

L1-L2:  Mild far lateral disc bulging.  No stenosis.

L2-L3: Far lateral disc bulging. Mild facet hypertrophy greater on
the right. No associated stenosis.

L3-L4: Far lateral disc bulging, with a broad-based component on the
left. Mild facet hypertrophy. No spinal or lateral recess stenosis.
And only borderline to mild left L3 foraminal stenosis.

L4-L5: Previous decompression, interbody fusion. Residual irregular
circumferential disc osteophyte complex. No spinal or lateral recess
stenosis. Mild left and moderate right multifactorial L4 foraminal
stenosis (series 5, image 4 on the right).

L5-S1: Previous decompression and interbody fusion. Residual
circumferential disc osteophyte complex. Architectural distortion
and mild stenosis at the left lateral recess (descending left S1
nerve level). Moderate right and moderate to severe left L5
foraminal stenosis is multifactorial (series 5, image 12 on the
left).
IMPRESSION: 1. Previous decompression and interbody fusion at L4-L5 and L5-S1.
There is mild degenerative endplate marrow edema at the left
superior L4 endplate, and also in the central S1 vertebral body. But
no evidence of discrete vertebral fracture.

2. No lumbar spinal stenosis, but somewhat bulky residual disc
osteophyte complex at both of those levels, affecting the right L4
(moderate) and bilateral L5 (moderate to severe greater on the left)
neural foramina. Mild left lateral recess stenosis at the left S1
nerve level.

3. Mostly far lateral lumbar disc bulging elsewhere, with up to mild
associated left L3 foraminal stenosis.
